# Patient Record
Sex: Male | Born: 1985
Health system: Southern US, Community
[De-identification: ages and names within clinical notes are randomized; demographics above are authoritative.]

## PROBLEM LIST (undated history)

## (undated) DIAGNOSIS — J45909 Unspecified asthma, uncomplicated: Secondary | ICD-10-CM

## (undated) DIAGNOSIS — F1910 Other psychoactive substance abuse, uncomplicated: Secondary | ICD-10-CM

## (undated) HISTORY — PX: SHOULDER SURGERY: SHX246

## (undated) HISTORY — DX: Unspecified asthma, uncomplicated: J45.909

## (undated) HISTORY — DX: Other psychoactive substance abuse, uncomplicated: F19.10

---

## 2016-01-01 DIAGNOSIS — J189 Pneumonia, unspecified organism: Secondary | ICD-10-CM | POA: Diagnosis not present

## 2016-01-17 ENCOUNTER — Other Ambulatory Visit: Payer: Self-pay

## 2016-01-29 DIAGNOSIS — R062 Wheezing: Secondary | ICD-10-CM | POA: Diagnosis not present

## 2016-01-29 DIAGNOSIS — F17210 Nicotine dependence, cigarettes, uncomplicated: Secondary | ICD-10-CM | POA: Diagnosis not present

## 2016-01-29 DIAGNOSIS — J45901 Unspecified asthma with (acute) exacerbation: Secondary | ICD-10-CM | POA: Diagnosis not present

## 2016-02-17 ENCOUNTER — Encounter (HOSPITAL_COMMUNITY): Payer: Self-pay | Admitting: Emergency Medicine

## 2016-02-17 ENCOUNTER — Inpatient Hospital Stay (HOSPITAL_COMMUNITY)
Admission: EM | Admit: 2016-02-17 | Discharge: 2016-02-19 | DRG: 202 | Disposition: A | Discharge status: Home / Self Care | Payer: BLUE CROSS/BLUE SHIELD | Attending: Internal Medicine | Admitting: Internal Medicine

## 2016-02-17 ENCOUNTER — Observation Stay (HOSPITAL_COMMUNITY): Payer: BLUE CROSS/BLUE SHIELD

## 2016-02-17 ENCOUNTER — Emergency Department (HOSPITAL_COMMUNITY): Payer: BLUE CROSS/BLUE SHIELD

## 2016-02-17 DIAGNOSIS — F1120 Opioid dependence, uncomplicated: Secondary | ICD-10-CM | POA: Diagnosis present

## 2016-02-17 DIAGNOSIS — J310 Chronic rhinitis: Secondary | ICD-10-CM | POA: Diagnosis present

## 2016-02-17 DIAGNOSIS — T380X5A Adverse effect of glucocorticoids and synthetic analogues, initial encounter: Secondary | ICD-10-CM | POA: Diagnosis present

## 2016-02-17 DIAGNOSIS — I10 Essential (primary) hypertension: Secondary | ICD-10-CM | POA: Diagnosis present

## 2016-02-17 DIAGNOSIS — J45902 Unspecified asthma with status asthmaticus: Secondary | ICD-10-CM | POA: Diagnosis not present

## 2016-02-17 DIAGNOSIS — J209 Acute bronchitis, unspecified: Secondary | ICD-10-CM | POA: Diagnosis not present

## 2016-02-17 DIAGNOSIS — Z720 Tobacco use: Secondary | ICD-10-CM | POA: Diagnosis not present

## 2016-02-17 DIAGNOSIS — J4540 Moderate persistent asthma, uncomplicated: Secondary | ICD-10-CM | POA: Diagnosis present

## 2016-02-17 DIAGNOSIS — R739 Hyperglycemia, unspecified: Secondary | ICD-10-CM | POA: Diagnosis present

## 2016-02-17 DIAGNOSIS — F17210 Nicotine dependence, cigarettes, uncomplicated: Secondary | ICD-10-CM | POA: Diagnosis present

## 2016-02-17 DIAGNOSIS — R0602 Shortness of breath: Secondary | ICD-10-CM | POA: Diagnosis not present

## 2016-02-17 DIAGNOSIS — J9601 Acute respiratory failure with hypoxia: Secondary | ICD-10-CM | POA: Diagnosis present

## 2016-02-17 DIAGNOSIS — J9801 Acute bronchospasm: Secondary | ICD-10-CM | POA: Diagnosis present

## 2016-02-17 DIAGNOSIS — F10239 Alcohol dependence with withdrawal, unspecified: Secondary | ICD-10-CM | POA: Diagnosis present

## 2016-02-17 DIAGNOSIS — R069 Unspecified abnormalities of breathing: Secondary | ICD-10-CM | POA: Diagnosis not present

## 2016-02-17 DIAGNOSIS — R0902 Hypoxemia: Secondary | ICD-10-CM | POA: Diagnosis present

## 2016-02-17 DIAGNOSIS — F11288 Opioid dependence with other opioid-induced disorder: Secondary | ICD-10-CM | POA: Diagnosis not present

## 2016-02-17 DIAGNOSIS — R0600 Dyspnea, unspecified: Secondary | ICD-10-CM

## 2016-02-17 DIAGNOSIS — F419 Anxiety disorder, unspecified: Secondary | ICD-10-CM | POA: Diagnosis present

## 2016-02-17 LAB — LACTIC ACID, PLASMA
Lactic Acid, Venous: 2.7 mmol/L (ref 0.5–2.0)
Lactic Acid, Venous: 3 mmol/L (ref 0.5–2.0)

## 2016-02-17 LAB — PHOSPHORUS: Phosphorus: 2.1 mg/dL — ABNORMAL LOW (ref 2.5–4.6)

## 2016-02-17 LAB — HEPATIC FUNCTION PANEL
ALBUMIN: 5 g/dL (ref 3.5–5.0)
ALK PHOS: 89 U/L (ref 38–126)
ALT: 35 U/L (ref 17–63)
AST: 25 U/L (ref 15–41)
BILIRUBIN TOTAL: 1.1 mg/dL (ref 0.3–1.2)
Bilirubin, Direct: 0.2 mg/dL (ref 0.1–0.5)
Indirect Bilirubin: 0.9 mg/dL (ref 0.3–0.9)
TOTAL PROTEIN: 8 g/dL (ref 6.5–8.1)

## 2016-02-17 LAB — BASIC METABOLIC PANEL
Anion gap: 8 (ref 5–15)
BUN: 14 mg/dL (ref 6–20)
CALCIUM: 9.3 mg/dL (ref 8.9–10.3)
CO2: 27 mmol/L (ref 22–32)
Chloride: 104 mmol/L (ref 101–111)
Creatinine, Ser: 0.85 mg/dL (ref 0.61–1.24)
GFR calc Af Amer: >60 mL/min (ref >60)
GLUCOSE: 120 mg/dL — AB (ref 65–99)
Potassium: 3.5 mmol/L (ref 3.5–5.1)
SODIUM: 139 mmol/L (ref 135–145)

## 2016-02-17 LAB — D-DIMER, QUANTITATIVE (NOT AT ARMC)

## 2016-02-17 LAB — MRSA PCR SCREENING: MRSA BY PCR: NEGATIVE

## 2016-02-17 LAB — CBC WITH DIFFERENTIAL/PLATELET
BASOS ABS: 0.1 10*3/uL (ref 0.0–0.1)
BASOS PCT: 1 %
EOS ABS: 0.9 10*3/uL — AB (ref 0.0–0.7)
EOS PCT: 6 %
HCT: 46.6 % (ref 39.0–52.0)
Hemoglobin: 16.8 g/dL (ref 13.0–17.0)
Lymphocytes Relative: 11 %
Lymphs Abs: 1.6 10*3/uL (ref 0.7–4.0)
MCH: 30.7 pg (ref 26.0–34.0)
MCHC: 36.1 g/dL — ABNORMAL HIGH (ref 30.0–36.0)
MCV: 85.2 fL (ref 78.0–100.0)
MONO ABS: 0.9 10*3/uL (ref 0.1–1.0)
Monocytes Relative: 6 %
Neutro Abs: 11.8 10*3/uL — ABNORMAL HIGH (ref 1.7–7.7)
Neutrophils Relative %: 78 %
PLATELETS: 201 10*3/uL (ref 150–400)
RBC: 5.47 MIL/uL (ref 4.22–5.81)
RDW: 12.6 % (ref 11.5–15.5)
WBC: 15.2 10*3/uL — AB (ref 4.0–10.5)

## 2016-02-17 LAB — BLOOD GAS, ARTERIAL
Acid-base deficit: 0.2 mmol/L (ref 0.0–2.0)
BICARBONATE: 24.9 meq/L — AB (ref 20.0–24.0)
DRAWN BY: 257701
O2 CONTENT: 6 L/min
O2 Saturation: 90.1 %
PH ART: 7.369 (ref 7.350–7.450)
Patient temperature: 98.6
TCO2: 21.3 mmol/L (ref 0–100)
pCO2 arterial: 44.2 mmHg (ref 35.0–45.0)
pO2, Arterial: 62.1 mmHg — ABNORMAL LOW (ref 80.0–100.0)

## 2016-02-17 LAB — TROPONIN I

## 2016-02-17 LAB — ETHANOL: Alcohol, Ethyl (B): <5 mg/dL (ref <5)

## 2016-02-17 LAB — BRAIN NATRIURETIC PEPTIDE: B Natriuretic Peptide: 9.4 pg/mL (ref 0.0–100.0)

## 2016-02-17 LAB — RAPID URINE DRUG SCREEN, HOSP PERFORMED
Amphetamines: NOT DETECTED
BENZODIAZEPINES: NOT DETECTED
Barbiturates: NOT DETECTED
COCAINE: NOT DETECTED
OPIATES: POSITIVE — AB
Tetrahydrocannabinol: NOT DETECTED

## 2016-02-17 LAB — MAGNESIUM: Magnesium: 2.2 mg/dL (ref 1.7–2.4)

## 2016-02-17 LAB — PROCALCITONIN: Procalcitonin: <0.1 ng/mL

## 2016-02-17 MED ORDER — HYDRALAZINE HCL 20 MG/ML IJ SOLN
INTRAMUSCULAR | Status: AC
  Filled 2016-02-17: qty 1

## 2016-02-17 MED ORDER — ALBUTEROL SULFATE (2.5 MG/3ML) 0.083% IN NEBU
2.5000 mg | INHALATION_SOLUTION | RESPIRATORY_TRACT | Status: DC | PRN
  Administered 2016-02-17: 2.5 mg via RESPIRATORY_TRACT
  Filled 2016-02-17 (×2): qty 3

## 2016-02-17 MED ORDER — METHYLPREDNISOLONE SODIUM SUCC 40 MG IJ SOLR
40.0000 mg | Freq: Three times a day (TID) | INTRAMUSCULAR | Status: DC
  Administered 2016-02-17: 40 mg via INTRAVENOUS
  Filled 2016-02-17 (×2): qty 1

## 2016-02-17 MED ORDER — BUPRENORPHINE HCL 2 MG SL SUBL
2.0000 mg | SUBLINGUAL_TABLET | Freq: Every day | SUBLINGUAL | Status: DC
  Filled 2016-02-17: qty 1

## 2016-02-17 MED ORDER — ALBUTEROL (5 MG/ML) CONTINUOUS INHALATION SOLN
10.0000 mg/h | INHALATION_SOLUTION | RESPIRATORY_TRACT | Status: DC
  Administered 2016-02-17: 10 mg/h via RESPIRATORY_TRACT
  Filled 2016-02-17: qty 20

## 2016-02-17 MED ORDER — GABAPENTIN 100 MG PO CAPS
200.0000 mg | ORAL_CAPSULE | Freq: Three times a day (TID) | ORAL | Status: DC
  Administered 2016-02-17 – 2016-02-18 (×2): 200 mg via ORAL
  Filled 2016-02-17 (×2): qty 2

## 2016-02-17 MED ORDER — HYDRALAZINE HCL 20 MG/ML IJ SOLN
10.0000 mg | INTRAMUSCULAR | Status: DC | PRN
  Filled 2016-02-17: qty 1

## 2016-02-17 MED ORDER — ACETAMINOPHEN 325 MG PO TABS
650.0000 mg | ORAL_TABLET | Freq: Four times a day (QID) | ORAL | Status: DC | PRN
  Administered 2016-02-18: 650 mg via ORAL
  Filled 2016-02-17: qty 2

## 2016-02-17 MED ORDER — SODIUM CHLORIDE 0.9 % IV SOLN: 250.0000 mL | INTRAVENOUS | Status: DC | PRN

## 2016-02-17 MED ORDER — TRAZODONE HCL 50 MG PO TABS
100.0000 mg | ORAL_TABLET | Freq: Every day | ORAL | Status: DC
  Administered 2016-02-17 – 2016-02-18 (×2): 100 mg via ORAL
  Filled 2016-02-17: qty 2
  Filled 2016-02-17: qty 1
  Filled 2016-02-17: qty 2

## 2016-02-17 MED ORDER — NICOTINE 14 MG/24HR TD PT24
14.0000 mg | MEDICATED_PATCH | Freq: Every day | TRANSDERMAL | Status: DC
  Administered 2016-02-17 – 2016-02-19 (×3): 14 mg via TRANSDERMAL
  Filled 2016-02-17 (×3): qty 1

## 2016-02-17 MED ORDER — BUDESONIDE 0.5 MG/2ML IN SUSP
0.5000 mg | Freq: Two times a day (BID) | RESPIRATORY_TRACT | Status: DC
  Administered 2016-02-17 – 2016-02-18 (×3): 0.5 mg via RESPIRATORY_TRACT
  Filled 2016-02-17 (×3): qty 2

## 2016-02-17 MED ORDER — GUAIFENESIN ER 600 MG PO TB12
600.0000 mg | ORAL_TABLET | Freq: Two times a day (BID) | ORAL | Status: DC
  Administered 2016-02-17: 600 mg via ORAL
  Filled 2016-02-17: qty 1

## 2016-02-17 MED ORDER — SODIUM CHLORIDE 0.9% FLUSH
3.0000 mL | Freq: Two times a day (BID) | INTRAVENOUS | Status: DC
  Administered 2016-02-17: 3 mL via INTRAVENOUS

## 2016-02-17 MED ORDER — GUAIFENESIN 100 MG/5ML PO SOLN: 10.0000 mL | ORAL | Status: DC | PRN

## 2016-02-17 MED ORDER — HYDRALAZINE HCL 20 MG/ML IJ SOLN
10.0000 mg | INTRAMUSCULAR | Status: AC
  Administered 2016-02-17: 10 mg via INTRAVENOUS

## 2016-02-17 MED ORDER — IPRATROPIUM-ALBUTEROL 0.5-2.5 (3) MG/3ML IN SOLN: 3.0000 mL | Freq: Four times a day (QID) | RESPIRATORY_TRACT | Status: DC

## 2016-02-17 MED ORDER — LEVOFLOXACIN IN D5W 750 MG/150ML IV SOLN
750.0000 mg | INTRAVENOUS | Status: DC
  Administered 2016-02-17: 750 mg via INTRAVENOUS
  Filled 2016-02-17: qty 150

## 2016-02-17 MED ORDER — DEXMEDETOMIDINE HCL IN NACL 200 MCG/50ML IV SOLN
0.4000 ug/kg/h | INTRAVENOUS | Status: DC
  Administered 2016-02-17 (×2): 0.4 ug/kg/h via INTRAVENOUS
  Filled 2016-02-17 (×2): qty 50

## 2016-02-17 MED ORDER — SODIUM CHLORIDE 0.9% FLUSH: 3.0000 mL | INTRAVENOUS | Status: DC | PRN

## 2016-02-17 MED ORDER — POLYETHYLENE GLYCOL 3350 17 G PO PACK: 17.0000 g | PACK | Freq: Every day | ORAL | Status: DC | PRN

## 2016-02-17 MED ORDER — IPRATROPIUM BROMIDE 0.02 % IN SOLN
0.5000 mg | RESPIRATORY_TRACT | Status: DC
  Administered 2016-02-17 – 2016-02-18 (×4): 0.5 mg via RESPIRATORY_TRACT
  Filled 2016-02-17 (×4): qty 2.5

## 2016-02-17 MED ORDER — DIPHENHYDRAMINE HCL 50 MG/ML IJ SOLN
50.0000 mg | Freq: Once | INTRAMUSCULAR | Status: AC
  Administered 2016-02-17: 50 mg via INTRAVENOUS
  Filled 2016-02-17: qty 1

## 2016-02-17 MED ORDER — IPRATROPIUM BROMIDE 0.02 % IN SOLN
0.5000 mg | Freq: Once | RESPIRATORY_TRACT | Status: AC
  Administered 2016-02-17: 0.5 mg via RESPIRATORY_TRACT
  Filled 2016-02-17: qty 2.5

## 2016-02-17 MED ORDER — LEVALBUTEROL HCL 0.63 MG/3ML IN NEBU
0.6300 mg | INHALATION_SOLUTION | Freq: Four times a day (QID) | RESPIRATORY_TRACT | Status: DC
  Administered 2016-02-17 – 2016-02-19 (×8): 0.63 mg via RESPIRATORY_TRACT
  Filled 2016-02-17 (×8): qty 3

## 2016-02-17 MED ORDER — HEPARIN SODIUM (PORCINE) 5000 UNIT/ML IJ SOLN
5000.0000 [IU] | Freq: Three times a day (TID) | INTRAMUSCULAR | Status: DC
  Administered 2016-02-17 – 2016-02-19 (×5): 5000 [IU] via SUBCUTANEOUS
  Filled 2016-02-17 (×7): qty 1

## 2016-02-17 MED ORDER — MAGNESIUM SULFATE 2 GM/50ML IV SOLN
2.0000 g | Freq: Once | INTRAVENOUS | Status: AC
  Administered 2016-02-17: 2 g via INTRAVENOUS
  Filled 2016-02-17: qty 50

## 2016-02-17 MED ORDER — ACETAMINOPHEN 650 MG RE SUPP: 650.0000 mg | Freq: Four times a day (QID) | RECTAL | Status: DC | PRN

## 2016-02-17 MED ORDER — METHYLPREDNISOLONE SODIUM SUCC 40 MG IJ SOLR
40.0000 mg | Freq: Four times a day (QID) | INTRAMUSCULAR | Status: DC
Start: 2016-02-18 — End: 2016-02-19
  Administered 2016-02-18 – 2016-02-19 (×6): 40 mg via INTRAVENOUS
  Filled 2016-02-17 (×6): qty 1

## 2016-02-17 MED ORDER — AZITHROMYCIN 250 MG PO TABS
500.0000 mg | ORAL_TABLET | Freq: Every day | ORAL | Status: DC
  Administered 2016-02-17: 500 mg via ORAL
  Filled 2016-02-17: qty 1

## 2016-02-17 MED ORDER — ONDANSETRON HCL 4 MG/2ML IJ SOLN: 4.0000 mg | Freq: Four times a day (QID) | INTRAMUSCULAR | Status: DC | PRN

## 2016-02-17 MED ORDER — SODIUM CHLORIDE 0.9 % IV SOLN
INTRAVENOUS | Status: DC
  Administered 2016-02-17: 18:00:00 via INTRAVENOUS

## 2016-02-17 MED ORDER — SENNA 8.6 MG PO TABS
1.0000 | ORAL_TABLET | Freq: Two times a day (BID) | ORAL | Status: DC
  Administered 2016-02-18 – 2016-02-19 (×3): 8.6 mg via ORAL
  Filled 2016-02-17 (×4): qty 1

## 2016-02-17 MED ORDER — ONDANSETRON HCL 4 MG PO TABS: 4.0000 mg | ORAL_TABLET | Freq: Four times a day (QID) | ORAL | Status: DC | PRN

## 2016-02-17 MED ORDER — INSULIN ASPART 100 UNIT/ML ~~LOC~~ SOLN
0.0000 [IU] | SUBCUTANEOUS | Status: DC
  Administered 2016-02-17: 2 [IU] via SUBCUTANEOUS
  Administered 2016-02-18: 3 [IU] via SUBCUTANEOUS
  Administered 2016-02-18 (×2): 2 [IU] via SUBCUTANEOUS

## 2016-02-17 NOTE — Progress Notes (Signed)
Pt's BP high, HR ST 120 -130s, increased work of breathing. Notified RR RN to assess. Notified MD. New orders written. No changes with medications ordered

## 2016-02-17 NOTE — ED Notes (Signed)
Per EMS pt comes from urgent care for shortness of breath, non productive cough, and wheezing. Patient was treated couple weeks ago for PNA and completed round of antibiotics but still having cough and SHOB.  Pat given in route and at Urgent care Albuterol 7.5mg , Atrovent 0.5mg , Solu-Medrol 125mg .  Patient initial saturation at Urgent care was 87% on room air, BP 158/98.

## 2016-02-17 NOTE — Progress Notes (Signed)
RT attempted BIPAP and Pt became very anxious and stated that he couldn't breathe.  RT removed BIPAP and placed Pt back on [place] at 10 LPM O2.  Pt resting comfortably at this time, MD aware.  RT to monitor and assess as needed.

## 2016-02-17 NOTE — Progress Notes (Signed)
CRITICAL VALUE ALERT  Critical value received:  Lactic acid 3.0  Date of notification:  02/17/16  Time of notification:  2323  Critical value read back: Yes  Nurse who received alert:  Anne HahnSarah Beth Early, RN  MD notified (1st page):  L. Harduk  Time of first page:  2325  MD notified (2nd page):  Time of second page:  Responding MD:  Wyn ForsterL. Harduk  Time MD responded:  2325

## 2016-02-17 NOTE — ED Provider Notes (Signed)
CSN: [id number]     Arrival date & time 02/17/16  1315 History   First MD Initiated Contact with Patient 02/17/16 1317     Chief Complaint  Patient presents with  . Shortness of Breath  . Wheezing    HPI   Evan Hines is an 30 y.o. male with no significant PMH who presents to the ED via EMS from urgent care for evaluation of SOB and wheezing. Pt states that he was treated for pneumonia one month ago. Since then has felt well until 2 days ago when he began devloping some nasal congestion and progressively worsening productive cough. States that after his coughing fits he feels short of breath, and the shortness of breath is getting worse. States today it was to the point where he didn't know if he would be able to walk from his car in the parking lot to the urgent care lobby. Pt was apparently hypoxic to 87% on RA on arrival to [facility]. He has thus far been given a total of 7.5mg  albuterol neb, 0.5 mg atrovent, and 125 mg solu-medrol. He denies h/o asthma or any other medical issues. Admits to smoking cigarettes but states he is trying to quit and has not smoked anything in five days. Prior to this week he smoked ~1 PPD. Denies other drug use.  History reviewed. No pertinent past medical history. Past Surgical History  Procedure Laterality Date  . Shoulder surgery     No family history on file. Social History  Substance Use Topics  . Smoking status: Smoker, Current Status Unknown    Types: Cigarettes  . Smokeless tobacco: Current User    Types: Chew  . Alcohol Use: 3.0 oz/week    5 Cans of beer per week    Review of Systems  All other systems reviewed and are negative.     Allergies  Review of patient's allergies indicates not on file.  Home Medications   Prior to Admission medications   Not on File   BP 154/100 mmHg  Pulse 99  Temp(Src) 98.1 F (36.7 C) (Oral)  Resp 24  SpO2 99% Physical Exam  Constitutional: He is oriented to person, place, and time.  Wearing nebulizer  mask  HENT:  Right Ear: External ear normal.  Left Ear: External ear normal.  Nose: Nose normal.  Mouth/Throat: Oropharynx is clear and moist. No oropharyngeal exudate.  Eyes: Conjunctivae and EOM are normal. Pupils are equal, round, and reactive to light.  Neck: Normal range of motion. Neck supple.  Cardiovascular: Normal rate, regular rhythm, normal heart sounds and intact distal pulses.   Pulmonary/Chest:  Mildly increased WOB  Mild tachypnea Diffuse bilateral inspiratory and expiratory wheezing. Bilateral diffuse soft rhonchi.  Abdominal: Soft. He exhibits no distension. There is no tenderness.  Musculoskeletal: He exhibits no edema.  Neurological: He is alert and oriented to person, place, and time. No cranial nerve deficit.  Skin: Skin is warm and dry.  Psychiatric: He has a normal mood and affect.  Nursing note and vitals reviewed.  Filed Vitals:   02/17/16 1409 02/17/16 1411 02/17/16 1414 02/17/16 1417  BP:      Pulse:      Temp:      TempSrc:      Resp:      SpO2: 90% 91% 94% 99%     ED Course  Procedures (including critical care time) Labs Review Labs Reviewed  BASIC METABOLIC PANEL - Abnormal; Notable for the following:    Glucose, Bld 120 (*)  All other components within normal limits  CBC WITH DIFFERENTIAL/PLATELET - Abnormal; Notable for the following:    WBC 15.2 (*)    MCHC 36.1 (*)    Neutro Abs 11.8 (*)    Eosinophils Absolute 0.9 (*)    All other components within normal limits  URINE RAPID DRUG SCREEN, HOSP PERFORMED    Imaging Review Dg Chest 2 View  02/17/2016  CLINICAL DATA:  Shortness of breath and wheezing EXAM: CHEST  2 VIEW COMPARISON:  None. FINDINGS: Cardiac shadow is within normal limits. The lungs are free of acute infiltrate or sizable effusion. Mild peribronchial cuffing is noted consistent with bronchitis/reactive airways disease. The upper abdomen is within normal limits. No bony abnormality is seen. IMPRESSION: Peribronchial  thickening as described. Electronically Signed   By: Alcide CleverMark  Lukens M.D.   On: 02/17/2016 14:14   I have personally reviewed and evaluated these images and lab results as part of my medical decision-making.   EKG Interpretation None      Meds ordered this encounter  Medications  . albuterol (PROVENTIL,VENTOLIN) solution continuous neb    Sig:   . magnesium sulfate IVPB 2 g 50 mL    Sig:   . ipratropium (ATROVENT) nebulizer solution 0.5 mg    Sig:     MDM   Final diagnoses:  Bronchospasm  Acute respiratory failure with hypoxia (HCC)    Will give continuous albuterol and mag sulfate. Will check CXR and basic labs. Pt with good SpO2 with mask in place.   Pt with continued inspiratory and expiratory wheezing on exam. Will add another dose of atrovent. CXR reveals bronchitic/RAD findings. Labs with leukocytosis of 15.2. ?viral. Low risk for PE other than cigarette use; no recent travel, h/o blood clots, LE edema or calf tenderness, malignancy, recent surgeries. On re-eval pt's mother is in the room and does mention that pt is on suboxone therapy, though pt continues to deny illicit drug use. Will add UDS. Unfortunately although pt maintains good SpO2 with nebullizer mask he de-sats to mid 80s on room air, even high 80s with nasal cannula. I discussed medical admission with pt and his mother for acute respiratory failure/bronchospasm. They are amenable with this plan.  I spoke to the hospitalist team who will come see and admit patient. Appreciate assistance.  Carlene CoriaSerena Y Dyani Babel, PA-C 02/17/16 [address]1506  Chai Routh Y Crystal LakeSam, PA-C 02/17/16 1511  Arby BarretteMarcy Pfeiffer, MD 02/17/16 [phone]

## 2016-02-17 NOTE — Progress Notes (Addendum)
CRITICAL VALUE ALERT  Critical value received:  Lactic acid 2.7  Date of notification:  02/17/16  Time of notification:  1940  Critical value read back: Yes  Nurse who received alert:  Leonia CoronaMelinda R Blima Jaimes, RN  MD notified (1st page):  [facility]  Time of first page:  1940  MD notified (2nd page):  Time of second page:  Responding MD:  Delphina CahillJose Angelo Dios  Time MD responded:  [id number]

## 2016-02-17 NOTE — ED Notes (Signed)
Bed: WA20 Expected date:  Expected time:  Means of arrival:  Comments: EMS- shortness of breath, young

## 2016-02-17 NOTE — Consult Note (Signed)
PULMONARY / CRITICAL CARE MEDICINE   Name: Evan Hines MRN: [id number] DOB: 04-14-1986    ADMISSION DATE:  02/17/2016 CONSULTATION DATE:  5/25  REFERRING MD:  TH  Dr. Shon Hale  CHIEF COMPLAINT:  SOB  HISTORY OF PRESENT ILLNESS:   Evan Hines is a 30 y.o. Male, with PMH Relevant for polysubstance abuse and tobacco use disorder who presents with concerns about recurrent respiratory problems with hypoxia . Patient was seen urgent care for evaluation of SOB and wheezing, he was found to be hypoxic and transferred to the ED by EMS after receiving steroids and breathing treatments. Pt states that he was treated for pneumonia with antibiotics and steroids one month ago. Since then has felt well until 2 days ago when he began devloping some nasal congestion and progressively worsening productive cough. No significant sneezing or itching eyes. States that after his coughing fits he feels short of breath, and the shortness of breath is getting worse. States today it was to the point where he didn't know if he would be able to walk from his car in the parking lot to the urgent care lobby. Pt was apparently hypoxic to 87% on RA on arrival to [facility]. He has thus far been given a total of 7.5mg  albuterol neb, 0.5 mg atrovent, and 125 mg solu-medrol. He denies h/o asthma. Despite being on 4-5 L of oxygen via nasal canula in the ED his O2 sats remained at 88-90%. Cough is mostly dry. No leg pains no leg swelling no pleuritic symptoms no chest pain. No prolonged immobilization, no recent surgery, no long travels.  Pt was admitted at the floors this pm.  He was noted to be wheezing more and more SOB when he walked to the BR. Plan was to transfer him to SDU and be placed on BiPaP.  PCCM was consulted.   Bipap was started and pt became claustrophobic. He was switched back to [place] with reservoir and looks more comfortable than bipap. Gets SOB when speaking sentences. Comfortable when at rest.   Pt was noted to be  anxious as well.  By history, pt looks like has asthma but has not been formally dxed. The last 1-2 yrs, he gets episodic SOB, wheezing, cough when seasons change or when he gets a bronchitis.  Usually gets better with PO pred and alb MDI/neb.  The last attack was start of May 2017 > got better with PO pred and Abx.  He was at baseline until 1-2 days ago. Prior to May, last attack was 1 yr ago.  Has not been intubated, no ED visit until today.   PAST MEDICAL HISTORY :  He  has no past medical history on file.  Polysubstance abuse.  As above.  (-) DVT, CA  PAST SURGICAL HISTORY: He  has past surgical history that includes Shoulder surgery.  No Known Allergies  No current facility-administered medications on file prior to encounter.   No current outpatient prescriptions on file prior to encounter.    FAMILY HISTORY:  His has no family status information on file.   Parents are healthy.   SOCIAL HISTORY: He  reports that he has been smoking Cigarettes.  He has been smoking about 1.00 pack per day. His smokeless tobacco use includes Chew. He reports that he drinks about 3.0 oz of alcohol per week. Allegedly stopped drinking weeks ago.  Single, lives with parents. Works at a [employer]. No children.  (-) recent sick contacts.   REVIEW OF SYSTEMS:  Recent SOB, cough, wheezing. Denies fevers, chills, cp. Rest of 14 point ROV was done and everything else was (-).   SUBJECTIVE:  As mentioned.  Has SOB, cough, wheeze.  Better now than when we has admitted earlier.   VITAL SIGNS: BP 177/86 mmHg  Pulse 113  Temp(Src) 98 F (36.7 C) (Oral)  Resp 24  SpO2 97%  HEMODYNAMICS:    VENTILATOR SETTINGS:    INTAKE / OUTPUT:    PHYSICAL EXAMINATION: General:  Awake, in mild distress.  Gets SOB after talking for awhile. Looks comfortable at rest.  Neuro:  CN grossly intact. (-) lateralizing signs.  HEENT:  PERLA, (-) NVD. (-) oral thrush.  Cardiovascular:  Good s1/s2. Tachycardic.  (-) s3/m/r/g Lungs:  Fair ae. Wheezing Bilaterally. Some accessory muscle use every now and then.  Abdomen:  (+) BS, soft, NT. (-) masses/tenderness Musculoskeletal:  Moves all extremities normally. N tone/strength. Skin:  Warm and dry. (-) rash/clubbing/edema/cyanosis  LABS:  BMET  Recent Labs Lab 02/17/16 1356  NA 139  K 3.5  CL 104  CO2 27  BUN 14  CREATININE 0.85  GLUCOSE 120*    Electrolytes  Recent Labs Lab 02/17/16 1356  CALCIUM 9.3    CBC  Recent Labs Lab 02/17/16 1357  WBC 15.2*  HGB 16.8  HCT 46.6  PLT 201    Coag's No results for input(s): APTT, INR in the last 168 hours.  Sepsis Markers No results for input(s): LATICACIDVEN, PROCALCITON, O2SATVEN in the last 168 hours.  ABG  Recent Labs Lab 02/17/16 1621  PHART 7.369  PCO2ART 44.2  PO2ART 62.1*    Liver Enzymes No results for input(s): AST, ALT, ALKPHOS, BILITOT, ALBUMIN in the last 168 hours.  Cardiac Enzymes No results for input(s): TROPONINI, PROBNP in the last 168 hours.  Glucose No results for input(s): GLUCAP in the last 168 hours.  Imaging Dg Chest 2 View  02/17/2016  CLINICAL DATA:  Shortness of breath and wheezing EXAM: CHEST  2 VIEW COMPARISON:  None. FINDINGS: Cardiac shadow is within normal limits. The lungs are free of acute infiltrate or sizable effusion. Mild peribronchial cuffing is noted consistent with bronchitis/reactive airways disease. The upper abdomen is within normal limits. No bony abnormality is seen. IMPRESSION: Peribronchial thickening as described. Electronically Signed   By: Alcide CleverMark  Lukens M.D.   On: 02/17/2016 14:14     STUDIES:    CULTURES: Blood culture 5/25 > MRSA 5/25 >   ANTIBIOTICS: Azithromycin 5/25 Levofloxacin 5/25 >   SIGNIFICANT EVENTS: 5/25 admitted for asthmatic bronchits. Got more SOB and desaturated on the floors. Transferred to SDU. PCCM consulted.   LINES/TUBES:   DISCUSSION: 55M, with polysubstance abuse and tobacco  abuse, with likely asthma (not been dxed before),  admitted for acute SOB, wheezing, cough, chest tightness. Was placed on 6L [place] at the floors and he desatuarated in high 80s walking to bathroom. Transferred to SDU and PCCM consulted.  Pt is also anxious.  Placed on Bipap and he became claustrophobic.  Switched to [place] with reservoir and he looks better than being on Bipap but still has inc WOB every now and then.  On 8-10L [place] and sats are 98%.    ASSESSMENT / PLAN:  PULMONARY A: Acute Hypoxemic Hypercapneic Resp Fx 2/2 Severe Asthma attack 2/2 bronchitis, R/O PNA Tobacco Abuse P:   By history, it seems patient has asthma which has never been diagnosed before.  He has episodes dyspnea, wheezing, shortness of breath, cough the last 2-3  years. Posey Rea of the triggers but usually with a change in weather and also when he has an infection. Has never been intubated. No ED visit until today. He gets better with by mouth prednisone and of the alb  MDI or nebulizer.  Patient became claustrophobic and more tachypneic  on the BiPAP with minimal settings. We switch him back to the nasal cannula with reservoir and he looks more comfortable. Currently on 8-10 L nasal cannula oxygen but his O2 saturations are 98-100%. Plan to cut down oxygen to keep O2 sats more than 92%.  We will keep an eye on him at the stepdown unit. Patient is also anxious. We'll try him on Precedex. He also has polysubstance abuse. Later on, we can try him on BiPAP again if he gets more short of breath. If he gets winded and could not tolerate BiPAP, he may end up being intubated.  He just finished an hour-long albuterol. Start Pulmicort neb twice a day, Atrovent every 4 hours, Xopenex every 6 hours Continue Medrol. Will make it every 6 hrs.  Start levofloxacin. Pan culture.  On discharge, most likely will need a prolonged prednisone taper. Most likely will also need to be on Symbicort or Dulera  or Advair. Will need PFTs as an  outpatient.  Counseled on smoking cessation.  CARDIOVASCULAR A:  HTN Sinus tachycardia P:  This is probably related to anxiety. Not sure if he is withdrawing. Drug screen is negative except for opiates. Hydralazine 10 mg every 4 hours when necessary for blood pressure more than 160/90. Will observe. Continue IV fluids  RENAL A:   No issues P:   Cont IVF  GASTROINTESTINAL A:   No issues P:   NPO for now  HEMATOLOGIC A:   No issues P:  Check cbc  INFECTIOUS A:   CXR with no evidence of PNA.  Has bronchitis P:   panculture Start levofloxacin Check lactate, PCT. If PCT elevated, add vanc  ENDOCRINE A:   No issues P:   CBG q4 with sliding scale  NEUROLOGIC A: Polysubstance abuse.  Possibel ETOH withdrawal  Anxiety P:   RASS goal: 0 Try precedex and see if it helps with anxiety/tachycardia/HTN Check alcohol level Need to resume Suboxone in am > need to verify dose, etc.    FAMILY  - Updates: I extensively discussed the case with the patient and his mother. Mentioned about the severity of his asthma exacerbation. Mentioned we may end up intubating the patient if he gets worse. Patient is a full code.  - Inter-disciplinary family meet or Palliative Care meeting due by:  June 1   Critical care time spent on this patient today was 30 minutes. I discussed the case with the hospitalist as well. Plan to keep patient in stepdown unit under the hospitalist service unless he gets worse overnight and he gets transferred to the ICU.  Pollie Meyer, MD Pulmonary and Critical Care Medicine [facility] Pager: [phone] After 3 pm or if no response, call [phone]  02/17/2016, 7:38 PM

## 2016-02-17 NOTE — H&P (Signed)
Patient Demographics:    Evan Hines, is a 30 y.o. male  MRN: [id number]   DOB - 1986-09-02  Admit Date - 02/17/2016  Outpatient Primary MD for the patient is JEWELL, Guy Sandifer, MD   Assessment & Plan:    Principal Problem:   Acute wheezy bronchitis Active Problems:   Tobacco abuse   Opiate dependence (HCC)/Suboxone Rx   1)Acute hypoxic respiratory failure- patient is requiring 4-5 L of oxygen by [place] to keep his O2 sats above 90% despite multiple bronchodilator treatment and IV Solu-Medrol. Check ABG, shortness of breath PERSISTED on and off for about 6-8 weeks,  he is also tachycardic. D -dimer is negative. Continue oxygen supplementation,   No significantly increased work of breathing at this time so c/n oxygen via , unless ABG reveals hypercapnia or if increased work of breathing then use BiPAP. Recurrent visits to the ED and Urgent care over the last couple of Months for respiratory problems with hypoxia. Place patient on Telemetry please due to Hypoxia  2)Acute wheezy bronchitis in smoker-continue bronchodilators, azithromycin, and mucolytics. Leukocytosis may be secondary to steroids, CXR without definite pneumonia. Give Solu-Medrol 40 mg every 8 hours, patient previously received Solu Medrol 125 mg IV 1 today.   3)Nicotine Dependence- patient has been too sick to smoke in the last 5 days, he continues to chew tobacco, abstinence from Nicotine encouraged, give nicotine patch  4)H/o Polysubstance Abuse- encouraged, patient is currently on Suboxone (has been on same dose for 3 years), UDS positive only for opiates. Patient's Mother is concerned about patient's alcohol use given that he is Suboxone. Patient denies recent alcohol use, mom states no etoh use in last several days. Low risk for DTs. Be judicious with  opiates and benzodiazepines in this particular patient  With History of - Reviewed by me  History reviewed. No pertinent past medical history.    Past Surgical History  Procedure Laterality Date  . Shoulder surgery        Chief Complaint  Patient presents with  . Shortness of Breath  . Wheezing  . Cough      HPI:    Evan Hines  is a 30 y.o. male, Evan Hines is an 30 y.o. male with PMH Relevant for polysubstance abuse and tobacco use disorder who presents with concerns about recurrent respiratory problems with hypoxia . Patient was seen urgent care for evaluation of SOB and wheezing, he was found to be hypoxic and transferred to the ED by EMS after receiving steroids and breathing treatments. Pt states that he was treated for pneumonia with antibiotics and steroids one month ago. Since then has felt well until 2 days ago when he began devloping some nasal congestion and progressively worsening productive cough. No significant sneezing or itching eyes. States that after his coughing fits he feels short of breath, and the shortness of breath is getting worse. States today it was to the point  where he didn't know if he would be able to walk from his car in the parking lot to the urgent care lobby. Pt was apparently hypoxic to 87% on RA on arrival to [facility]. He has thus far been given a total of 7.5mg  albuterol neb, 0.5 mg atrovent, and 125 mg solu-medrol. He denies h/o asthma. Despite being on 4-5 L of oxygen via nasal canula in the ED his  O2 sats remained at 88-90%. Cough is mostly dry. No leg pains no leg swelling no pleuritic symptoms no chest pain. No prolonged immobilization, no recent surgery, no long travels. Overall no significant risk factor for DVT/PE identified. Patient's mother is at bedside, questions answered   Review of systems:    In addition to the HPI above,   A full 12 point Review of Systems was done, except as stated above, all other Review of Systems were  negative.    Social History:  Reviewed by me    Social History  Substance Use Topics  . Smoking status: Smoker, Current Status Unknown -- 1.00 packs/day    Types: Cigarettes  . Smokeless tobacco: Current User    Types: Chew  . Alcohol Use: 3.0 oz/week    5 Cans of beer per week       Family History :  Reviewed by me   History reviewed. No pertinent family history.    Home Medications:   Prior to Admission medications   Medication Sig Start Date End Date Taking? Authorizing Provider  albuterol (PROVENTIL) (2.5 MG/3ML) 0.083% nebulizer solution Inhale 3 mLs into the lungs every 6 (six) hours as needed for wheezing or shortness of breath.  01/31/16  Yes Historical Provider, MD  ibuprofen (ADVIL,MOTRIN) 200 MG tablet Take 400 mg by mouth every 6 (six) hours as needed for moderate pain.   Yes Historical Provider, MD  PROAIR HFA 108 [phone] Base) MCG/ACT inhaler Inhale 2 puffs into the lungs every 4 (four) hours as needed for wheezing or shortness of breath.  01/05/16  Yes Historical Provider, MD  SUBOXONE 8-2 MG FILM Place 1 Film under the tongue 2 (two) times daily. 02/10/16  Yes Historical Provider, MD  traZODone (DESYREL) 50 MG tablet Take 50 mg by mouth at bedtime as needed for sleep.  12/01/15  Yes Historical Provider, MD     Allergies:    No Known Allergies   Physical Exam:   Vitals  Blood pressure 130/85, pulse 95, temperature 98.1 F (36.7 C), temperature source Oral, resp. rate 20, SpO2 96 %.  O2 sats while I was in the room was 88-90% on 4-5 L of oxygen  Physical Examination: General appearance - alert, well appearing, and in no distress and Able to speak in complete sentences Mental status - alert, oriented to person, place, and time,  Eyes - sclera anicteric Neck - supple, no JVD elevation , Chest - scattered wheezes and rhonchi bilaterally, air movement is diminished but symmetrical Heart - S1 and S2 normal,  tachycardic with heart rate in the 110s Abdomen - soft,  nontender, nondistended, no masses or organomegaly Neurological - screening mental status exam normal, neck supple without rigidity, cranial nerves II through XII intact, DTR's normal and symmetric Extremities - no pedal edema noted, intact peripheral pulses , neg Homan's Skin - warm, dry    Data Review:    CBC  Recent Labs Lab 02/17/16 1357  WBC 15.2*  HGB 16.8  HCT 46.6  PLT 201  MCV 85.2  MCH 30.7  MCHC  36.1*  RDW 12.6  LYMPHSABS 1.6  MONOABS 0.9  EOSABS 0.9*  BASOSABS 0.1   ------------------------------------------------------------------------------------------------------------------  Chemistries   Recent Labs Lab 02/17/16 1356  NA 139  K 3.5  CL 104  CO2 27  GLUCOSE 120*  BUN 14  CREATININE 0.85  CALCIUM 9.3   ------------------------------------------------------------------------------------------------------------------ CrCl cannot be calculated (Unknown ideal weight.). ------------------------------------------------------------------------------------------------------------------ No results for input(s): TSH, T4TOTAL, T3FREE, THYROIDAB in the last 72 hours.  Invalid input(s): FREET3   Coagulation profile No results for input(s): INR, PROTIME in the last 168 hours. ------------------------------------------------------------------------------------------------------------------- No results for input(s): DDIMER in the last 72 hours. -------------------------------------------------------------------------------------------------------------------  Cardiac Enzymes No results for input(s): CKMB, TROPONINI, MYOGLOBIN in the last 168 hours.  Invalid input(s): CK ------------------------------------------------------------------------------------------------------------------ No results found for: BNP   ---------------------------------------------------------------------------------------------------------------  Urinalysis No results found  for: COLORURINE, APPEARANCEUR, LABSPEC, PHURINE, GLUCOSEU, HGBUR, BILIRUBINUR, KETONESUR, PROTEINUR, UROBILINOGEN, NITRITE, LEUKOCYTESUR  ----------------------------------------------------------------------------------------------------------------   Imaging Results:    Dg Chest 2 View  02/17/2016  CLINICAL DATA:  Shortness of breath and wheezing EXAM: CHEST  2 VIEW COMPARISON:  None. FINDINGS: Cardiac shadow is within normal limits. The lungs are free of acute infiltrate or sizable effusion. Mild peribronchial cuffing is noted consistent with bronchitis/reactive airways disease. The upper abdomen is within normal limits. No bony abnormality is seen. IMPRESSION: Peribronchial thickening as described. Electronically Signed   By: Alcide CleverMark  Lukens M.D.   On: 02/17/2016 14:14    Radiological Exams on Admission: Dg Chest 2 View  02/17/2016  CLINICAL DATA:  Shortness of breath and wheezing EXAM: CHEST  2 VIEW COMPARISON:  None. FINDINGS: Cardiac shadow is within normal limits. The lungs are free of acute infiltrate or sizable effusion. Mild peribronchial cuffing is noted consistent with bronchitis/reactive airways disease. The upper abdomen is within normal limits. No bony abnormality is seen. IMPRESSION: Peribronchial thickening as described. Electronically Signed   By: Alcide CleverMark  Lukens M.D.   On: 02/17/2016 14:14    DVT Prophylaxis heparin  AM Labs Ordered, also please review Full Orders  Family Communication: Admission, patients condition and plan of care including tests being ordered have been discussed with the patient and his mother who indicate understanding and agree with the plan   Code Status - Full Code  Likely DC to  Home   Condition   fair  Isaic Syler M.D on 02/17/2016 at 4:17 PM   Between 7am to 7pm - Pager - [phone]  After 7pm go to [email or web address] - password [email or web address]  [facility] - Office  [phone]  Dragon dictation system was used to create this note, attempts  have been made to correct errors, however presence of uncorrected errors is not a reflection quality of care provided.

## 2016-02-18 DIAGNOSIS — J209 Acute bronchitis, unspecified: Secondary | ICD-10-CM | POA: Diagnosis not present

## 2016-02-18 DIAGNOSIS — R0600 Dyspnea, unspecified: Secondary | ICD-10-CM | POA: Insufficient documentation

## 2016-02-18 DIAGNOSIS — F11288 Opioid dependence with other opioid-induced disorder: Secondary | ICD-10-CM | POA: Diagnosis not present

## 2016-02-18 DIAGNOSIS — F1120 Opioid dependence, uncomplicated: Secondary | ICD-10-CM | POA: Diagnosis present

## 2016-02-18 DIAGNOSIS — J45902 Unspecified asthma with status asthmaticus: Secondary | ICD-10-CM | POA: Diagnosis present

## 2016-02-18 DIAGNOSIS — R739 Hyperglycemia, unspecified: Secondary | ICD-10-CM | POA: Diagnosis present

## 2016-02-18 DIAGNOSIS — F419 Anxiety disorder, unspecified: Secondary | ICD-10-CM | POA: Diagnosis present

## 2016-02-18 DIAGNOSIS — J9601 Acute respiratory failure with hypoxia: Secondary | ICD-10-CM | POA: Diagnosis not present

## 2016-02-18 DIAGNOSIS — F17210 Nicotine dependence, cigarettes, uncomplicated: Secondary | ICD-10-CM | POA: Diagnosis present

## 2016-02-18 DIAGNOSIS — R0902 Hypoxemia: Secondary | ICD-10-CM

## 2016-02-18 DIAGNOSIS — J4552 Severe persistent asthma with status asthmaticus: Secondary | ICD-10-CM | POA: Diagnosis not present

## 2016-02-18 DIAGNOSIS — Z720 Tobacco use: Secondary | ICD-10-CM | POA: Diagnosis not present

## 2016-02-18 DIAGNOSIS — F10239 Alcohol dependence with withdrawal, unspecified: Secondary | ICD-10-CM | POA: Diagnosis present

## 2016-02-18 DIAGNOSIS — T380X5A Adverse effect of glucocorticoids and synthetic analogues, initial encounter: Secondary | ICD-10-CM | POA: Diagnosis present

## 2016-02-18 DIAGNOSIS — J9801 Acute bronchospasm: Secondary | ICD-10-CM | POA: Diagnosis not present

## 2016-02-18 DIAGNOSIS — J310 Chronic rhinitis: Secondary | ICD-10-CM | POA: Diagnosis present

## 2016-02-18 DIAGNOSIS — I10 Essential (primary) hypertension: Secondary | ICD-10-CM | POA: Diagnosis present

## 2016-02-18 LAB — BLOOD GAS, ARTERIAL
Acid-base deficit: 0.4 mmol/L (ref 0.0–2.0)
BICARBONATE: 23.3 meq/L (ref 20.0–24.0)
Drawn by: 308601
O2 CONTENT: 8 L/min
O2 Saturation: 95 %
PCO2 ART: 37 mmHg (ref 35.0–45.0)
PH ART: 7.415 (ref 7.350–7.450)
Patient temperature: 98.6
TCO2: 20.1 mmol/L (ref 0–100)
pO2, Arterial: 74.5 mmHg — ABNORMAL LOW (ref 80.0–100.0)

## 2016-02-18 LAB — CBC
HEMATOCRIT: 43.2 % (ref 39.0–52.0)
HEMOGLOBIN: 15.2 g/dL (ref 13.0–17.0)
MCH: 30.2 pg (ref 26.0–34.0)
MCHC: 35.2 g/dL (ref 30.0–36.0)
MCV: 85.7 fL (ref 78.0–100.0)
Platelets: 197 10*3/uL (ref 150–400)
RBC: 5.04 MIL/uL (ref 4.22–5.81)
RDW: 12.9 % (ref 11.5–15.5)
WBC: 12.7 10*3/uL — AB (ref 4.0–10.5)

## 2016-02-18 LAB — BASIC METABOLIC PANEL
ANION GAP: 8 (ref 5–15)
BUN: 13 mg/dL (ref 6–20)
CHLORIDE: 105 mmol/L (ref 101–111)
CO2: 22 mmol/L (ref 22–32)
Calcium: 9.1 mg/dL (ref 8.9–10.3)
Creatinine, Ser: 0.72 mg/dL (ref 0.61–1.24)
Glucose, Bld: 155 mg/dL — ABNORMAL HIGH (ref 65–99)
POTASSIUM: 3.9 mmol/L (ref 3.5–5.1)
SODIUM: 135 mmol/L (ref 135–145)

## 2016-02-18 LAB — URINALYSIS, ROUTINE W REFLEX MICROSCOPIC
BILIRUBIN URINE: NEGATIVE
Glucose, UA: NEGATIVE mg/dL
HGB URINE DIPSTICK: NEGATIVE
Ketones, ur: NEGATIVE mg/dL
Leukocytes, UA: NEGATIVE
NITRITE: NEGATIVE
PROTEIN: NEGATIVE mg/dL
Specific Gravity, Urine: 1.021 (ref 1.005–1.030)
pH: 6.5 (ref 5.0–8.0)

## 2016-02-18 LAB — GLUCOSE, CAPILLARY
GLUCOSE-CAPILLARY: 176 mg/dL — AB (ref 65–99)
GLUCOSE-CAPILLARY: 130 mg/dL — AB (ref 65–99)
GLUCOSE-CAPILLARY: 122 mg/dL — AB (ref 65–99)
GLUCOSE-CAPILLARY: 160 mg/dL — AB (ref 65–99)
Glucose-Capillary: 136 mg/dL — ABNORMAL HIGH (ref 65–99)
Glucose-Capillary: 127 mg/dL — ABNORMAL HIGH (ref 65–99)
Glucose-Capillary: 122 mg/dL — ABNORMAL HIGH (ref 65–99)

## 2016-02-18 LAB — TROPONIN I: Troponin I: <0.03 ng/mL (ref <0.031)

## 2016-02-18 LAB — PROCALCITONIN

## 2016-02-18 MED ORDER — GUAIFENESIN ER 600 MG PO TB12
1200.0000 mg | ORAL_TABLET | Freq: Two times a day (BID) | ORAL | Status: DC
Start: 2016-02-18 — End: 2016-02-19
  Administered 2016-02-18 – 2016-02-19 (×3): 1200 mg via ORAL
  Filled 2016-02-18 (×3): qty 2

## 2016-02-18 MED ORDER — K PHOS MONO-SOD PHOS DI & MONO 155-852-130 MG PO TABS
500.0000 mg | ORAL_TABLET | Freq: Three times a day (TID) | ORAL | Status: DC
Start: 2016-02-18 — End: 2016-02-19
  Administered 2016-02-18 – 2016-02-19 (×4): 500 mg via ORAL
  Filled 2016-02-18 (×9): qty 2

## 2016-02-18 MED ORDER — FLUTICASONE PROPIONATE 50 MCG/ACT NA SUSP
2.0000 | Freq: Two times a day (BID) | NASAL | Status: DC
  Administered 2016-02-18 – 2016-02-19 (×3): 2 via NASAL
  Filled 2016-02-18: qty 16

## 2016-02-18 MED ORDER — POTASSIUM CHLORIDE CRYS ER 20 MEQ PO TBCR
40.0000 meq | EXTENDED_RELEASE_TABLET | Freq: Once | ORAL | Status: AC
  Administered 2016-02-18: 40 meq via ORAL
  Filled 2016-02-18: qty 2

## 2016-02-18 MED ORDER — ARFORMOTEROL TARTRATE 15 MCG/2ML IN NEBU
15.0000 ug | INHALATION_SOLUTION | Freq: Two times a day (BID) | RESPIRATORY_TRACT | Status: DC
  Administered 2016-02-18 (×2): 15 ug via RESPIRATORY_TRACT
  Filled 2016-02-18 (×2): qty 2

## 2016-02-18 MED ORDER — SALINE SPRAY 0.65 % NA SOLN
1.0000 | NASAL | Status: DC | PRN
  Administered 2016-02-18: 1 via NASAL
  Filled 2016-02-18: qty 44

## 2016-02-18 MED ORDER — FUROSEMIDE 10 MG/ML IJ SOLN
40.0000 mg | Freq: Once | INTRAMUSCULAR | Status: AC
  Administered 2016-02-18: 40 mg via INTRAVENOUS
  Filled 2016-02-18: qty 4

## 2016-02-18 MED ORDER — SALINE SPRAY 0.65 % NA SOLN
2.0000 | Freq: Two times a day (BID) | NASAL | Status: DC
  Administered 2016-02-18 (×2): 2 via NASAL
  Filled 2016-02-18: qty 44

## 2016-02-18 MED ORDER — INSULIN ASPART 100 UNIT/ML ~~LOC~~ SOLN: 0.0000 [IU] | Freq: Every day | SUBCUTANEOUS | Status: DC

## 2016-02-18 MED ORDER — SALINE SPRAY 0.65 % NA SOLN: 1.0000 | NASAL | Status: DC | PRN

## 2016-02-18 MED ORDER — MONTELUKAST SODIUM 10 MG PO TABS
10.0000 mg | ORAL_TABLET | Freq: Every day | ORAL | Status: DC
Start: 2016-02-18 — End: 2016-02-19
  Administered 2016-02-18: 10 mg via ORAL
  Filled 2016-02-18 (×2): qty 1

## 2016-02-18 MED ORDER — INSULIN ASPART 100 UNIT/ML ~~LOC~~ SOLN
0.0000 [IU] | Freq: Three times a day (TID) | SUBCUTANEOUS | Status: DC
  Administered 2016-02-18 – 2016-02-19 (×3): 3 [IU] via SUBCUTANEOUS

## 2016-02-18 MED ORDER — BUPRENORPHINE HCL 2 MG SL SUBL
8.0000 mg | SUBLINGUAL_TABLET | Freq: Two times a day (BID) | SUBLINGUAL | Status: DC
  Administered 2016-02-18 – 2016-02-19 (×3): 8 mg via SUBLINGUAL
  Filled 2016-02-18 (×3): qty 4

## 2016-02-18 MED ORDER — LORATADINE 10 MG PO TABS
10.0000 mg | ORAL_TABLET | Freq: Every day | ORAL | Status: DC
  Administered 2016-02-18 – 2016-02-19 (×2): 10 mg via ORAL
  Filled 2016-02-18 (×3): qty 1

## 2016-02-18 NOTE — Progress Notes (Addendum)
PULMONARY / CRITICAL CARE MEDICINE   Name: Evan Hines MRN: [id number] DOB: 1986/07/17    ADMISSION DATE:  02/17/2016 CONSULTATION DATE:  02/17/2016  REFERRING MD:  Dr. Mariea Clonts  CHIEF COMPLAINT:  Short of breath  SUBJECTIVE:  Breathing better.  Still has nasal congestion.  Not as much cough/wheeze.  VITAL SIGNS: BP 127/80 mmHg  Pulse 95  Temp(Src) 98.7 F (37.1 C) (Axillary)  Resp 18  Ht  (1.803 m)  Wt 166 lb 10.7 oz (75.6 kg)  BMI 23.26 kg/m2  SpO2 91%  INTAKE / OUTPUT: I/O last 3 completed shifts: In: 1819.3 [P.O.:60; I.V.:1609.3; IV Piggyback:150] Out: 650 [Urine:650]  PHYSICAL EXAMINATION: General:  alert Neuro:  Normal strength HEENT:  Boggy nasal mucosa Cardiovascular:  Regular, no murmur Lungs:  Diffuse b/l wheeze Abdomen:  Soft, non tender Musculoskeletal:  No edema Skin:  No rashes  LABS:  BMET  Recent Labs Lab 02/17/16 1356 02/18/16 0103  NA 139 135  K 3.5 3.9  CL 104 105  CO2 27 22  BUN 14 13  CREATININE 0.85 0.72  GLUCOSE 120* 155*    Electrolytes  Recent Labs Lab 02/17/16 1356 02/17/16 2242 02/18/16 0103  CALCIUM 9.3  --  9.1  MG  --  2.2  --   PHOS  --  2.1*  --     CBC  Recent Labs Lab 02/17/16 1357 02/18/16 0103  WBC 15.2* 12.7*  HGB 16.8 15.2  HCT 46.6 43.2  PLT 201 197    Coag's No results for input(s): APTT, INR in the last 168 hours.  Sepsis Markers  Recent Labs Lab 02/17/16 1928 02/17/16 2242 02/18/16 0103  LATICACIDVEN 2.7* 3.0*  --   PROCALCITON  --  <0.10 <0.10    ABG  Recent Labs Lab 02/17/16 1621 02/18/16 0458  PHART 7.369 7.415  PCO2ART 44.2 37.0  PO2ART 62.1* 74.5*    Liver Enzymes  Recent Labs Lab 02/17/16 1928  AST 25  ALT 35  ALKPHOS 89  BILITOT 1.1  ALBUMIN 5.0    Cardiac Enzymes  Recent Labs Lab 02/17/16 1928 02/18/16 0103 02/18/16 0646  TROPONINI <0.03 <0.03 <0.03    Glucose  Recent Labs Lab 02/17/16 2033 02/17/16 2317 02/18/16 0406  02/18/16 0810  GLUCAP 136* 176* 127* 130*    Imaging Dg Chest 2 View  02/17/2016  CLINICAL DATA:  Shortness of breath and wheezing EXAM: CHEST  2 VIEW COMPARISON:  None. FINDINGS: Cardiac shadow is within normal limits. The lungs are free of acute infiltrate or sizable effusion. Mild peribronchial cuffing is noted consistent with bronchitis/reactive airways disease. The upper abdomen is within normal limits. No bony abnormality is seen. IMPRESSION: Peribronchial thickening as described. Electronically Signed   By: Alcide Clever M.D.   On: 02/17/2016 14:14   Dg Chest Port 1 View  02/17/2016  CLINICAL DATA:  Smoker with shortness of breath today EXAM: PORTABLE CHEST 1 VIEW COMPARISON:  02/17/2016. FINDINGS: 1957 hours. The heart size and mediastinal contours are stable. Compared with the examination done earlier today, the lungs appear mildly hyperinflated. No airspace disease, edema, pleural effusion or pneumothorax identified. There are no acute osseous findings. IMPRESSION: Mild hyperinflation. No other significant changes from exam done earlier today. Electronically Signed   By: Carey Bullocks M.D.   On: 02/17/2016 20:14     STUDIES:   CULTURES: 5/25 Blood >>  ANTIBIOTICS: 5/25 Zithromax >> 5/25  5/25 Levaquin >> 5/26  SIGNIFICANT EVENTS: 5/25 Admit  LINES/TUBES:  DISCUSSION: 30 yo  male smoker with dyspnea, wheezing, nasal congestion, productive cough, hypoxia from asthmatic bronchitis.  Hx of polysubstance abuse.  He was tx for PNA on month prior to this admission.  ASSESSMENT / PLAN:  Acute hypoxic respiratory failure 2nd to asthmatic bronchitis >> has elevated eosinophils on peripheral smear. - oxygen to keep SpO2 > 92% - brovana, pulmicort, singulair, claritin, prn xopenx - continue solumedrol - procalcitonin negative, no infiltrate on CXR >> d/c levaquin - will need outpt pulmonary follow up  Rhinitis. - nasal irrigation, flonase, singulair, claritin  Tobacco abuse.   - nicotine patch  Polysubstance abuse. - subutex  Steroid induced hyperglycemia. - SSI while on high dose steroids  Updated pt's mother at bedside.  Coralyn HellingVineet Dallas Torok, MD [facility] 02/18/2016, 9:21 AM Pager:  [phone] After 3pm call: [phone]

## 2016-02-18 NOTE — Care Management Note (Signed)
Case Management Note  Patient Details  Name: Evan Hines MRN: [id number] Date of Birth: 11/10/1985  Subjective/Objective:           resp failure with o2 support         Action/Plan:Date:  Feb 18, 2016 Chart reviewed for concurrent status and case management needs. Will continue to follow patient for changes and needs: Expected discharge date: [id number] Marcelle SmilingRhonda Davis, BSN, [place]3, [place]M   [phone]   Expected Discharge Date:   (unknown)               Expected Discharge Plan:  Home/Self Care  In-House Referral:  NA  Discharge planning Services  CM Consult  Post Acute Care Choice:  NA Choice offered to:  NA  DME Arranged:    DME Agency:     HH Arranged:    HH Agency:     Status of Service:  In process, will continue to follow  Medicare Important Message Given:    Date Medicare IM Given:    Medicare IM give by:    Date Additional Medicare IM Given:    Additional Medicare Important Message give by:     If discussed at Long Length of Stay Meetings, dates discussed:    Additional Comments:  Golda AcreDavis, Rhonda Lynn, RN 02/18/2016, 10:24 AM

## 2016-02-18 NOTE — Clinical Documentation Improvement (Signed)
Internal Medicine  Abnormal Lab/Test Results:  Lactic acid level   3.0   2.7 (on admit)   Possible Clinical Conditions associated with below indicators    Lactic Acidosis  Other Condition  Cannot Clinically Determine    Evaluation :  Lactic Acid, plasma    Please exercise your independent, professional judgment when responding. A specific answer is not anticipated or expected.   Thank You,  Lavonda JumboLawanda J Hyacinth Marcelli Health Information Management [place] [phone]

## 2016-02-18 NOTE — Progress Notes (Signed)
PROGRESS NOTE  Evan PontJonathan Sledge  ZOX:[id number] DOB: 06/08/1986 DOA: 02/17/2016 PCP: De BlanchJEWELL, JAMES E, MD Outpatient Specialists:  Subjective: Seen with mother at bedside, still short of breath was minimally productive cough.  Brief Narrative:  30 year old male, smoker, admitted with shortness of breath, wheezing and hypoxia.  Assessment & Plan:   Principal Problem:   Acute respiratory failure with hypoxia (HCC) Active Problems:   Acute wheezy bronchitis   Tobacco abuse   Opiate dependence (HCC)/Suboxone Rx   Hypoxia   Bronchospasm   Acute hypoxic respiratory failure Presented with hypoxia of 87% on room air. D-dimer is negative, CXR is negative for infiltrates This is likely secondary to asthmatic bronchitis. Provided oxygen as needed, wean off of oxygen as tolerated.  Acute asthmatic bronchitis No previous history of asthma, but had similar problem previously for more than one episode. Procalcitonin is negative, Levaquin discontinued, no chest x-ray infiltrates. Treat with high-dose steroids, bronchodilators, mucolytics, antitussives and oxygen disease.  Tobacco abuse Patient counseled extensively.  Polysubstance abuse On Subutex, restarted, mother reported alcohol page drinking will follow closely.  Steroids induced hyperglycemia SSI while he was on high-dose steroids.   DVT prophylaxis: Lovenox Code Status: Full Code Family Communication: Discussed with the patient and mother at bedside. Disposition Plan:  Diet: Diet regular Room service appropriate?: Yes; Fluid consistency:: Thin  Consultants:   PCCM  Procedures:   None  Antimicrobials:   Levaquin, discontinued  Objective: Filed Vitals:   02/18/16 0500 02/18/16 0600 02/18/16 0707 02/18/16 0804  BP:  127/80    Pulse: 103 95    Temp:   98.7 F (37.1 C)   TempSrc:   Axillary   Resp: 19 18    Height:      Weight:      SpO2: 91% 93%  91%    Intake/Output Summary (Last 24 hours) at 02/18/16  0933 Last data filed at 02/18/16 0600  Gross per 24 hour  Intake 1819.28 ml  Output    650 ml  Net 1169.28 ml   Filed Weights   02/17/16 2000  Weight: 75.6 kg (166 lb 10.7 oz)    Examination: General exam: Appears calm and comfortable  Respiratory system: Clear to auscultation. Respiratory effort normal. Cardiovascular system: S1 & S2 heard, RRR. No JVD, murmurs, rubs, gallops or clicks. No pedal edema. Gastrointestinal system: Abdomen is nondistended, soft and nontender. No organomegaly or masses felt. Normal bowel sounds heard. Central nervous system: Alert and oriented. No focal neurological deficits. Extremities: Symmetric 5 x 5 power. Skin: No rashes, lesions or ulcers Psychiatry: Judgement and insight appear normal. Mood & affect appropriate.   Data Reviewed: I have personally reviewed following labs and imaging studies  CBC:  Recent Labs Lab 02/17/16 1357 02/18/16 0103  WBC 15.2* 12.7*  NEUTROABS 11.8*  --   HGB 16.8 15.2  HCT 46.6 43.2  MCV 85.2 85.7  PLT 201 197   Basic Metabolic Panel:  Recent Labs Lab 02/17/16 1356 02/17/16 2242 02/18/16 0103  NA 139  --  135  K 3.5  --  3.9  CL 104  --  105  CO2 27  --  22  GLUCOSE 120*  --  155*  BUN 14  --  13  CREATININE 0.85  --  0.72  CALCIUM 9.3  --  9.1  MG  --  2.2  --   PHOS  --  2.1*  --    GFR: Estimated Creatinine Clearance: 147.7 mL/min (by C-G formula based on Cr of  0.72). Liver Function Tests:  Recent Labs Lab 02/17/16 1928  AST 25  ALT 35  ALKPHOS 89  BILITOT 1.1  PROT 8.0  ALBUMIN 5.0   No results for input(s): LIPASE, AMYLASE in the last 168 hours. No results for input(s): AMMONIA in the last 168 hours. Coagulation Profile: No results for input(s): INR, PROTIME in the last 168 hours. Cardiac Enzymes:  Recent Labs Lab 02/17/16 1928 02/18/16 0103 02/18/16 0646  TROPONINI <0.03 <0.03 <0.03   BNP (last 3 results) No results for input(s): PROBNP in the last 8760  hours. HbA1C: No results for input(s): HGBA1C in the last 72 hours. CBG:  Recent Labs Lab 02/17/16 2033 02/17/16 2317 02/18/16 0406 02/18/16 0810  GLUCAP 136* 176* 127* 130*   Lipid Profile: No results for input(s): CHOL, HDL, LDLCALC, TRIG, CHOLHDL, LDLDIRECT in the last 72 hours. Thyroid Function Tests: No results for input(s): TSH, T4TOTAL, FREET4, T3FREE, THYROIDAB in the last 72 hours. Anemia Panel: No results for input(s): VITAMINB12, FOLATE, FERRITIN, TIBC, IRON, RETICCTPCT in the last 72 hours. Urine analysis:    Component Value Date/Time   COLORURINE YELLOW 02/18/2016 0348   APPEARANCEUR CLEAR 02/18/2016 0348   LABSPEC 1.021 02/18/2016 0348   PHURINE 6.5 02/18/2016 0348   GLUCOSEU NEGATIVE 02/18/2016 0348   HGBUR NEGATIVE 02/18/2016 0348   BILIRUBINUR NEGATIVE 02/18/2016 0348   KETONESUR NEGATIVE 02/18/2016 0348   PROTEINUR NEGATIVE 02/18/2016 0348   NITRITE NEGATIVE 02/18/2016 0348   LEUKOCYTESUR NEGATIVE 02/18/2016 0348   Sepsis Labs: @LABRCNTIP (procalcitonin:4,lacticidven:4)  ) Recent Results (from the past 240 hour(s))  MRSA PCR Screening     Status: None   Collection Time: 02/17/16  8:09 PM  Result Value Ref Range Status   MRSA by PCR NEGATIVE NEGATIVE Final    Comment:        The GeneXpert MRSA Assay (FDA approved for NASAL specimens only), is one component of a comprehensive MRSA colonization surveillance program. It is not intended to diagnose MRSA infection nor to guide or monitor treatment for MRSA infections.      Invalid input(s): PROCALCITONIN, LACTICACIDVEN   Radiology Studies: Dg Chest 2 View  02/17/2016  CLINICAL DATA:  Shortness of breath and wheezing EXAM: CHEST  2 VIEW COMPARISON:  None. FINDINGS: Cardiac shadow is within normal limits. The lungs are free of acute infiltrate or sizable effusion. Mild peribronchial cuffing is noted consistent with bronchitis/reactive airways disease. The upper abdomen is within normal limits. No  bony abnormality is seen. IMPRESSION: Peribronchial thickening as described. Electronically Signed   By: Alcide Clever M.D.   On: 02/17/2016 14:14   Dg Chest Port 1 View  02/17/2016  CLINICAL DATA:  Smoker with shortness of breath today EXAM: PORTABLE CHEST 1 VIEW COMPARISON:  02/17/2016. FINDINGS: 1957 hours. The heart size and mediastinal contours are stable. Compared with the examination done earlier today, the lungs appear mildly hyperinflated. No airspace disease, edema, pleural effusion or pneumothorax identified. There are no acute osseous findings. IMPRESSION: Mild hyperinflation. No other significant changes from exam done earlier today. Electronically Signed   By: Carey Bullocks M.D.   On: 02/17/2016 20:14        Scheduled Meds: . arformoterol  15 mcg Nebulization BID  . budesonide (PULMICORT) nebulizer solution  0.5 mg Nebulization BID  . buprenorphine  8 mg Sublingual BID  . fluticasone  2 spray Each Nare BID  . guaiFENesin  1,200 mg Oral BID  . heparin  5,000 Units Subcutaneous Q8H  . insulin aspart  0-20 Units Subcutaneous TID WC  . insulin aspart  0-5 Units Subcutaneous QHS  . levalbuterol  0.63 mg Nebulization Q6H  . loratadine  10 mg Oral Daily  . methylPREDNISolone (SOLU-MEDROL) injection  40 mg Intravenous Q6H  . montelukast  10 mg Oral QHS  . nicotine  14 mg Transdermal Daily  . senna  1 tablet Oral BID  . sodium chloride  2 spray Each Nare BID  . traZODone  100 mg Oral QHS   Continuous Infusions:       Time spent: 35 minutes    Analis Distler A, MD [facility] Pager [phone]  If 7PM-7AM, please contact night-coverage [email or web address] Password [facility] 02/18/2016, 9:33 AM

## 2016-02-19 DIAGNOSIS — J45901 Unspecified asthma with (acute) exacerbation: Secondary | ICD-10-CM

## 2016-02-19 LAB — CBC
HCT: 45.8 % (ref 39.0–52.0)
HEMOGLOBIN: 15.6 g/dL (ref 13.0–17.0)
MCH: 29.8 pg (ref 26.0–34.0)
MCHC: 34.1 g/dL (ref 30.0–36.0)
MCV: 87.4 fL (ref 78.0–100.0)
Platelets: 242 10*3/uL (ref 150–400)
RBC: 5.24 MIL/uL (ref 4.22–5.81)
RDW: 13.4 % (ref 11.5–15.5)
WBC: 15.9 10*3/uL — ABNORMAL HIGH (ref 4.0–10.5)

## 2016-02-19 LAB — BASIC METABOLIC PANEL
ANION GAP: 10 (ref 5–15)
BUN: 23 mg/dL — AB (ref 6–20)
CALCIUM: 9.4 mg/dL (ref 8.9–10.3)
CO2: 24 mmol/L (ref 22–32)
CREATININE: 0.82 mg/dL (ref 0.61–1.24)
Chloride: 104 mmol/L (ref 101–111)
GFR calc Af Amer: >60 mL/min (ref >60)
GFR calc non Af Amer: >60 mL/min (ref >60)
Glucose, Bld: 137 mg/dL — ABNORMAL HIGH (ref 65–99)
Potassium: 3.9 mmol/L (ref 3.5–5.1)
Sodium: 138 mmol/L (ref 135–145)

## 2016-02-19 LAB — PHOSPHORUS: PHOSPHORUS: 4.4 mg/dL (ref 2.5–4.6)

## 2016-02-19 LAB — MAGNESIUM: Magnesium: 2.5 mg/dL — ABNORMAL HIGH (ref 1.7–2.4)

## 2016-02-19 LAB — PROCALCITONIN

## 2016-02-19 LAB — GLUCOSE, CAPILLARY: Glucose-Capillary: 143 mg/dL — ABNORMAL HIGH (ref 65–99)

## 2016-02-19 MED ORDER — PREDNISONE 20 MG PO TABS: 20.0000 mg | ORAL_TABLET | Freq: Every day | ORAL | Status: DC

## 2016-02-19 MED ORDER — MOMETASONE FURO-FORMOTEROL FUM 200-5 MCG/ACT IN AERO
2.0000 | INHALATION_SPRAY | Freq: Two times a day (BID) | RESPIRATORY_TRACT | Status: DC
  Administered 2016-02-19: 2 via RESPIRATORY_TRACT
  Filled 2016-02-19: qty 8.8

## 2016-02-19 MED ORDER — PROAIR HFA 108 (90 BASE) MCG/ACT IN AERS: 2.0000 | INHALATION_SPRAY | RESPIRATORY_TRACT | Status: AC | PRN

## 2016-02-19 MED ORDER — PREDNISONE 10 MG PO TABS: 10.0000 mg | ORAL_TABLET | Freq: Every day | ORAL | Status: DC

## 2016-02-19 MED ORDER — PREDNISONE 20 MG PO TABS: 30.0000 mg | ORAL_TABLET | Freq: Every day | ORAL | Status: DC

## 2016-02-19 MED ORDER — PREDNISONE 20 MG PO TABS: 40.0000 mg | ORAL_TABLET | Freq: Every day | ORAL | Status: DC

## 2016-02-19 MED ORDER — AEROCHAMBER PLUS FLO-VU MEDIUM MISC
1.0000 | Freq: Once | Status: AC
  Administered 2016-02-19: 1
  Filled 2016-02-19: qty 1

## 2016-02-19 MED ORDER — MOMETASONE FURO-FORMOTEROL FUM 200-5 MCG/ACT IN AERO: 2.0000 | INHALATION_SPRAY | Freq: Two times a day (BID) | RESPIRATORY_TRACT | Status: DC

## 2016-02-19 MED ORDER — PREDNISONE 20 MG PO TABS
60.0000 mg | ORAL_TABLET | Freq: Every day | ORAL | Status: DC
  Administered 2016-02-19: 60 mg via ORAL
  Filled 2016-02-19: qty 3

## 2016-02-19 MED ORDER — PREDNISONE 10 MG PO TABS: ORAL_TABLET | ORAL | Status: DC

## 2016-02-19 NOTE — Discharge Summary (Addendum)
Physician Discharge Summary  Evan Hines WUJ:[id number] DOB: [birth year] DOA: 02/17/2016  PCP: De Blanch, MD  Admit date: 02/17/2016 Discharge date: 02/19/2016  Time spent: 40 minutes  Recommendations for Outpatient Follow-up:  1. Follow-up with [facility] in 1-2 weeks as outpatient.   Discharge Diagnoses:  Principal Problem:   Acute respiratory failure with hypoxia (HCC) Active Problems:   Acute wheezy bronchitis   Tobacco abuse   Opiate dependence (HCC)/Suboxone Rx   Hypoxia   Bronchospasm   Dyspnea   Discharge Condition: Stable  Diet recommendation: Heart healthy  Filed Weights   02/17/16 2000  Weight: 75.6 kg (166 lb 10.7 oz)    History of present illness:  Evan Hines is a 30 y.o. male, Evan Hines is an 30 y.o. male with PMH Relevant for polysubstance abuse and tobacco use disorder who presents with concerns about recurrent respiratory problems with hypoxia . Patient was seen urgent care for evaluation of SOB and wheezing, he was found to be hypoxic and transferred to the ED by EMS after receiving steroids and breathing treatments. Pt states that he was treated for pneumonia with antibiotics and steroids one month ago. Since then has felt well until 2 days ago when he began devloping some nasal congestion and progressively worsening productive cough. No significant sneezing or itching eyes. States that after his coughing fits he feels short of breath, and the shortness of breath is getting worse. States today it was to the point where he didn't know if he would be able to walk from his car in the parking lot to the urgent care lobby. Pt was apparently hypoxic to 87% on RA on arrival to [facility]. He has thus far been given a total of 7.5mg  albuterol neb, 0.5 mg atrovent, and 125 mg solu-medrol. He denies h/o asthma. Despite being on 4-5 L of oxygen via nasal canula in the ED his O2 sats remained at 88-90%. Cough is mostly dry. No leg pains no leg swelling no pleuritic  symptoms no chest pain. No prolonged immobilization, no recent surgery, no long travels. Overall no significant risk factor for DVT/PE identified. Patient's mother is at bedside, questions answered  Hospital Course:   Acute hypoxic respiratory failure Presented with hypoxia of 87% on room air. D-dimer is negative, CXR is negative for infiltrates. Initially admitted to the floor but transferred to stepdown because of labored breathing and respiratory distress. This is likely secondary to asthmatic bronchitis. This is resolved, on the day of discharge his sats was 94% with ambulation (on room air).  Acute asthmatic bronchitis No previous history of asthma, but had similar problem previously for more than one episode. Procalcitonin is negative, Levaquin discontinued, no chest x-ray infiltrates. Treated with steroids, bronchodilators, mucolytics, antitussives and oxygen disease. Seen by PCCM recommended Dulera, albuterol rescue inhaler and prednisone taper on discharge. Follow-up with PCCM.  Tobacco abuse Patient counseled extensively.  Polysubstance abuse On Subutex, restarted, mother reported alcohol page drinking will follow closely. Counseled extensively about drinking.  Steroids induced hyperglycemia SSI while he was in the hospital because of the steroids.  Elevated lactic acid Lactic acid was 2.7 on admission went up to 3.0, this is not related to sepsis, as his procalcitonin is less than 0.1. Lactic acid elevation is likely secondary to the acute asthma attack.  Procedures:  None  Consultations:  None  Discharge Exam: Filed Vitals:   02/19/16 0700 02/19/16 0800  BP: 123/34 134/89  Pulse: 81 90  Temp:  98.3 F (36.8 C)  Resp: 16 19  General: Alert and awake, oriented x3, not in any acute distress. HEENT: anicteric sclera, pupils reactive to light and accommodation, EOMI CVS: S1-S2 clear, no murmur rubs or gallops Chest: clear to auscultation bilaterally, no  wheezing, rales or rhonchi Abdomen: soft nontender, nondistended, normal bowel sounds, no organomegaly Extremities: no cyanosis, clubbing or edema noted bilaterally Neuro: Cranial nerves II-XII intact, no focal neurological deficits  Discharge Instructions   Discharge Instructions    Diet - low sodium heart healthy    Complete by:  As directed      Increase activity slowly    Complete by:  As directed           Current Discharge Medication List    START taking these medications   Details  mometasone-formoterol (DULERA) 200-5 MCG/ACT AERO Inhale 2 puffs into the lungs 2 (two) times daily. Qty: 1 Inhaler, Refills: 2    predniSONE (DELTASONE) 10 MG tablet Take 6 tablet PO daily for 3 days, then take 4 tablet PO daily for 3 days, then take 3 tablet PO daily for 3 days, then take 2 tablet PO daily for 2 days, then take 1 tablet PO daily for 2 days, then stop Qty: 45 tablet, Refills: 0      CONTINUE these medications which have CHANGED   Details  PROAIR HFA 108 (90 Base) MCG/ACT inhaler Inhale 2 puffs into the lungs every 4 (four) hours as needed for wheezing or shortness of breath. Qty: 1 Inhaler, Refills: 2      CONTINUE these medications which have NOT CHANGED   Details  albuterol (PROVENTIL) (2.5 MG/3ML) 0.083% nebulizer solution Inhale 3 mLs into the lungs every 6 (six) hours as needed for wheezing or shortness of breath.  Refills: 0    SUBOXONE 8-2 MG FILM Place 1 Film under the tongue 2 (two) times daily. Refills: 1    traZODone (DESYREL) 50 MG tablet Take 50 mg by mouth at bedtime as needed for sleep.  Refills: 0      STOP taking these medications     ibuprofen (ADVIL,MOTRIN) 200 MG tablet        No Known Allergies Follow-up Information    Follow up with Max Fickle, MD In 1 week.   Specialty:  Pulmonary Disease   Contact information:   [address] [place] [address] [phone]        The results of significant diagnostics from this  hospitalization (including imaging, microbiology, ancillary and laboratory) are listed below for reference.    Significant Diagnostic Studies: Dg Chest 2 View  02/17/2016  CLINICAL DATA:  Shortness of breath and wheezing EXAM: CHEST  2 VIEW COMPARISON:  None. FINDINGS: Cardiac shadow is within normal limits. The lungs are free of acute infiltrate or sizable effusion. Mild peribronchial cuffing is noted consistent with bronchitis/reactive airways disease. The upper abdomen is within normal limits. No bony abnormality is seen. IMPRESSION: Peribronchial thickening as described. Electronically Signed   By: Alcide Clever M.D.   On: 02/17/2016 14:14   Dg Chest Port 1 View  02/17/2016  CLINICAL DATA:  Smoker with shortness of breath today EXAM: PORTABLE CHEST 1 VIEW COMPARISON:  02/17/2016. FINDINGS: 1957 hours. The heart size and mediastinal contours are stable. Compared with the examination done earlier today, the lungs appear mildly hyperinflated. No airspace disease, edema, pleural effusion or pneumothorax identified. There are no acute osseous findings. IMPRESSION: Mild hyperinflation. No other significant changes from exam done earlier today. Electronically Signed  By: Carey BullocksWilliam  Veazey M.D.   On: 02/17/2016 20:14    Microbiology: Recent Results (from the past 240 hour(s))  MRSA PCR Screening     Status: None   Collection Time: 02/17/16  8:09 PM  Result Value Ref Range Status   MRSA by PCR NEGATIVE NEGATIVE Final    Comment:        The GeneXpert MRSA Assay (FDA approved for NASAL specimens only), is one component of a comprehensive MRSA colonization surveillance program. It is not intended to diagnose MRSA infection nor to guide or monitor treatment for MRSA infections.      Labs: Basic Metabolic Panel:  Recent Labs Lab 02/17/16 1356 02/17/16 2242 02/18/16 0103 02/19/16 0304  NA 139  --  135 138  K 3.5  --  3.9 3.9  CL 104  --  105 104  CO2 27  --  22 24  GLUCOSE 120*  --  155*  137*  BUN 14  --  13 23*  CREATININE 0.85  --  0.72 0.82  CALCIUM 9.3  --  9.1 9.4  MG  --  2.2  --  2.5*  PHOS  --  2.1*  --  4.4   Liver Function Tests:  Recent Labs Lab 02/17/16 1928  AST 25  ALT 35  ALKPHOS 89  BILITOT 1.1  PROT 8.0  ALBUMIN 5.0   No results for input(s): LIPASE, AMYLASE in the last 168 hours. No results for input(s): AMMONIA in the last 168 hours. CBC:  Recent Labs Lab 02/17/16 1357 02/18/16 0103 02/19/16 0304  WBC 15.2* 12.7* 15.9*  NEUTROABS 11.8*  --   --   HGB 16.8 15.2 15.6  HCT 46.6 43.2 45.8  MCV 85.2 85.7 87.4  PLT 201 197 242   Cardiac Enzymes:  Recent Labs Lab 02/17/16 1928 02/18/16 0103 02/18/16 0646  TROPONINI <0.03 <0.03 <0.03   BNP: BNP (last 3 results)  Recent Labs  02/17/16 1928  BNP 9.4    ProBNP (last 3 results) No results for input(s): PROBNP in the last 8760 hours.  CBG:  Recent Labs Lab 02/18/16 0810 02/18/16 1104 02/18/16 1548 02/18/16 1938 02/19/16 0732  GLUCAP 130* 122* 122* 160* 143*       Signed:  Clydia LlanoELMAHI,Fama Muenchow A MD.  [facility] 02/19/2016, 8:45 AM

## 2016-02-19 NOTE — Progress Notes (Signed)
Patient walked 380 ft (1 lap) around unit. HR remained around 110. Oxygen saturation remained around 94. Will continue to monitor. Will inform MD for D/C today.

## 2016-02-19 NOTE — Discharge Instructions (Signed)
Asthma Attack Prevention °While you may not be able to control the fact that you have asthma, you can take actions to prevent asthma attacks. The best way to prevent asthma attacks is to maintain good control of your asthma. You can achieve this by: °· Taking your medicines as directed. °· Avoiding things that can irritate your airways or make your asthma symptoms worse (asthma triggers). °· Keeping track of how well your asthma is controlled and of any changes in your symptoms. °· Responding quickly to worsening asthma symptoms (asthma attack). °· Seeking emergency care when it is needed. °WHAT ARE SOME WAYS TO PREVENT AN ASTHMA ATTACK? °Have a Plan °Work with your health care provider to create a written plan for managing and treating your asthma attacks (asthma action plan). This plan includes: °· A list of your asthma triggers and how you can avoid them. °· Information on when medicines should be taken and when their dosages should be changed. °· The use of a device that measures how well your lungs are working (peak flow meter). °Monitor Your Asthma °Use your peak flow meter and record your results in a journal every day. A drop in your peak flow numbers on one or more days may indicate the start of an asthma attack. This can happen even before you start to feel symptoms. You can prevent an asthma attack from getting worse by following the steps in your asthma action plan. °Avoid Asthma Triggers °Work with your asthma health care provider to find out what your asthma triggers are. This can be done by: °· Allergy testing. °· Keeping a journal that notes when asthma attacks occur and the factors that may have contributed to them. °· Determining if there are other medical conditions that are making your asthma worse. °Once you have determined your asthma triggers, take steps to avoid them. This may include avoiding excessive or prolonged exposure to: °· Dust. Have someone dust and vacuum your home for you once or  twice a week. Using a high-efficiency particulate arrestance (HEPA) vacuum is best. °· Smoke. This includes campfire smoke, forest fire smoke, and secondhand smoke from tobacco products. °· Pet dander. Avoid contact with animals that you know you are allergic to. °· Allergens from trees, grasses or pollens. Avoid spending a lot of time outdoors when pollen counts are high, and on very windy days. °· Very cold, dry, or humid air. °· Mold. °· Foods that contain high amounts of sulfites. °· Strong odors. °· Outdoor air pollutants, such as engine exhaust. °· Indoor air pollutants, such as aerosol sprays and fumes from household cleaners. °· Household pests, including dust mites and cockroaches, and pest droppings. °· Certain medicines, including NSAIDs. Always talk to your health care provider before stopping or starting any new medicines. °Medicines °Take over-the-counter and prescription medicines only as told by your health care provider. Many asthma attacks can be prevented by carefully following your medicine schedule. Taking your medicines correctly is especially important when you cannot avoid certain asthma triggers. °Act Quickly °If an asthma attack does happen, acting quickly can decrease how severe it is and how long it lasts. Take these steps:  °· Pay attention to your symptoms. If you are coughing, wheezing, or having difficulty breathing, do not wait to see if your symptoms go away on their own. Follow your asthma action plan. °· If you have followed your asthma action plan and your symptoms are not improving, call your health care provider or seek immediate medical care   at the nearest hospital. It is important to note how often you need to use your fast-acting rescue inhaler. If you are using your rescue inhaler more often, it may mean that your asthma is not under control. Adjusting your asthma treatment plan may help you to prevent future asthma attacks and help you to gain better control of your  condition. HOW CAN I PREVENT AN ASTHMA ATTACK WHEN I EXERCISE? Follow advice from your health care provider about whether you should use your fast-acting inhaler before exercising. Many people with asthma experience exercise-induced bronchoconstriction (EIB). This condition often worsens during vigorous exercise in cold, humid, or dry environments. Usually, people with EIB can stay very active by pre-treating with a fast-acting inhaler before exercising.   This information is not intended to replace advice given to you by your health care provider. Make sure you discuss any questions you have with your health care provider.   Document Released: 08/30/2009 Document Revised: 06/02/2015 Document Reviewed: 02/11/2015 Elsevier Interactive Patient Education 2016 [employer].  Respiratory failure is when your lungs are not working well and your breathing (respiratory) system fails. When respiratory failure occurs, it is difficult for your lungs to get enough oxygen, get rid of carbon dioxide, or both. Respiratory failure can be life threatening.  Respiratory failure can be acute or chronic. Acute respiratory failure is sudden, severe, and requires emergency medical treatment. Chronic respiratory failure is less severe, happens over time, and requires ongoing treatment.  WHAT ARE THE CAUSES OF ACUTE RESPIRATORY FAILURE?  Any problem affecting the heart or lungs can cause acute respiratory failure. Some of these causes include the following:  Chronic bronchitis and emphysema (COPD).   Blood clot going to a lung (pulmonary embolism).   Having water in the lungs caused by heart failure, lung injury, or infection (pulmonary edema).   Collapsed lung (pneumothorax).   Pneumonia.   Pulmonary fibrosis.   Obesity.   Asthma.   Heart failure.   Any type of trauma to the chest that can make breathing difficult.   Nerve or muscle diseases making chest movements difficult. HOW WILL MY ACUTE  RESPIRATORY FAILURE BE TREATED?  Treatment of acute respiratory failure depends on the cause of the respiratory failure. Usually, you will stay in the intensive care unit so your breathing can be watched closely. Treatment can include the following:  Oxygen. Oxygen can be delivered through the following:  Nasal cannula. This is small tubing that goes in your nose to give you oxygen.  Face mask. A face mask covers your nose and mouth to give you oxygen.  Medicine. Different medicines can be given to help with breathing. These can include:  Nebulizers. Nebulizers deliver medicines to open the air passages (bronchodilators). These medicines help to open or relax the airways in the lungs so you can breathe better. They can also help loosen mucus from your lungs.  Diuretics. Diuretic medicines can help you breathe better by getting rid of extra water in your body.  Steroids. Steroid medicines can help decrease swelling (inflammation) in your lungs.  Antibiotics.  Chest tube. If you have a collapsed lung (pneumothorax), a chest tube is placed to help reinflate the lung.  Noninvasive positive pressure ventilation (NPPV). This is a tight-fitting mask that goes over your nose and mouth. The mask has tubing that is attached to a machine. The machine blows air into the tubing, which helps to keep the tiny air sacs (alveoli) in your lungs open. This machine allows you to breathe  on your own.  Ventilator. A ventilator is a breathing machine. When on a ventilator, a breathing tube is put into the lungs. A ventilator is used when you can no longer breathe well enough on your own. You may have low oxygen levels or high carbon dioxide (CO2) levels in your blood. When you are on a ventilator, sedation and pain medicines are given to make you sleep so your lungs can heal. SEEK IMMEDIATE MEDICAL CARE IF:  You have shortness of breath (dyspnea) with or without activity.  You have rapid breathing  (tachypnea).  You are wheezing.  You are unable to say more than a few words without having to catch your breath.  You find it very difficult to function normally.  You have a fast heart rate.  You have a bluish color to your finger or toe nail beds.  You have confusion or drowsiness or both.   This information is not intended to replace advice given to you by your health care provider. Make sure you discuss any questions you have with your health care provider.   Document Released: 09/16/2013 Document Revised: 06/02/2015 Document Reviewed: 09/16/2013 Elsevier Interactive Patient Education 2016 Elsevier Inc.  Asthma, Adult Asthma is a condition of the lungs in which the airways tighten and narrow. Asthma can make it hard to breathe. Asthma cannot be cured, but medicine and lifestyle changes can help control it. Asthma may be started (triggered) by:  Animal skin flakes (dander).  Dust.  Cockroaches.  Pollen.  Mold.  Smoke.  Cleaning products.  Hair sprays or aerosol sprays.  Paint fumes or strong smells.  Cold air, weather changes, and winds.  Crying or laughing hard.  Stress.  Certain medicines or drugs.  Foods, such as dried fruit, potato chips, and sparkling grape juice.  Infections or conditions (colds, flu).  Exercise.  Certain medical conditions or diseases.  Exercise or tiring activities. HOME CARE   Take medicine as told by your doctor.  Use a peak flow meter as told by your doctor. A peak flow meter is a tool that measures how well the lungs are working.  Record and keep track of the peak flow meter's readings.  Understand and use the asthma action plan. An asthma action plan is a written plan for taking care of your asthma and treating your attacks.  To help prevent asthma attacks:  Do not smoke. Stay away from secondhand smoke.  Change your heating and air conditioning filter often.  Limit your use of fireplaces and wood  stoves.  Get rid of pests (such as roaches and mice) and their droppings.  Throw away plants if you see mold on them.  Clean your floors. Dust regularly. Use cleaning products that do not smell.  Have someone vacuum when you are not home. Use a vacuum cleaner with a HEPA filter if possible.  Replace carpet with wood, tile, or vinyl flooring. Carpet can trap animal skin flakes and dust.  Use allergy-proof pillows, mattress covers, and box spring covers.  Wash bed sheets and blankets every week in hot water and dry them in a dryer.  Use blankets that are made of polyester or cotton.  Clean bathrooms and kitchens with bleach. If possible, have someone repaint the walls in these rooms with mold-resistant paint. Keep out of the rooms that are being cleaned and painted.  Wash hands often. GET HELP IF:  You have make a whistling sound when breaking (wheeze), have shortness of breath, or have a cough  even if taking medicine to prevent attacks.  The colored mucus you cough up (sputum) is thicker than usual.  The colored mucus you cough up changes from clear or white to yellow, green, gray, or bloody.  You have problems from the medicine you are taking such as:  A rash.  Itching.  Swelling.  Trouble breathing.  You need reliever medicines more than 2-3 times a week.  Your peak flow measurement is still at 50-79% of your personal best after following the action plan for 1 hour.  You have a fever. GET HELP RIGHT AWAY IF:   You seem to be worse and are not responding to medicine during an asthma attack.  You are short of breath even at rest.  You get short of breath when doing very little activity.  You have trouble eating, drinking, or talking.  You have chest pain.  You have a fast heartbeat.  Your lips or fingernails start to turn blue.  You are light-headed, dizzy, or faint.  Your peak flow is less than 50% of your personal best.   This information is not  intended to replace advice given to you by your health care provider. Make sure you discuss any questions you have with your health care provider.   Document Released: 02/28/2008 Document Revised: 06/02/2015 Document Reviewed: 04/10/2013 Elsevier Interactive Patient Education [employer] Elsevier Inc.

## 2016-02-19 NOTE — Progress Notes (Signed)
[facility]  S: Feeling much better, less wheezing  O: Filed Vitals:   02/19/16 0500 02/19/16 0600 02/19/16 0700 02/19/16 0800  BP: 133/79 111/61 123/34   Pulse: 81 80 81   Temp:    98.3 F (36.8 C)  TempSrc:    Oral  Resp: 17 20 16    Height:      Weight:      SpO2: 95% 94% 94%    RA  Gen: well appearing, resting comfortably in bed, speaking in full sentences HENT: OP clear, neck supple PULM: wheezing bilaterally, good air movement, normal effort, no accessory muscle use CV: RRR, no mgr, trace edema GI: BS+, soft, nontender Derm: no cyanosis or rash Psyche: normal mood and affect  CXR images personally reviewed: Hyperinflation, no infiltrate  CBC    Component Value Date/Time   WBC 15.9* 02/19/2016 0304   RBC 5.24 02/19/2016 0304   HGB 15.6 02/19/2016 0304   HCT 45.8 02/19/2016 0304   PLT 242 02/19/2016 0304   MCV 87.4 02/19/2016 0304   MCH 29.8 02/19/2016 0304   MCHC 34.1 02/19/2016 0304   RDW 13.4 02/19/2016 0304   LYMPHSABS 1.6 02/17/2016 1357   MONOABS 0.9 02/17/2016 1357   EOSABS 0.9* 02/17/2016 1357   BASOSABS 0.1 02/17/2016 1357    Impression: Asthma exacerbation Tobacco abuse History of narcotic abuse  Discussion: He has improved significantly. He is still wheezing somewhat but air movement has dramatically improved. He needs follow-up with pulmonary medicine.  Plan: Change Brovana and Pul[facility]ort to [facility], use high-dose with spacer Use albuterol as needed for shortness of breath Prednisone taper at home: I will prescribe in-hospital dose which can be used for home taper Outpatient follow-up with [facility] I counseled the patient and his mother today on the importance of tobacco cessation as well as appropriate technique and importance of use of controller medication. I advised extensively on the roles of controller medications versus rescue medications.  Greater than 50% of this time was spent face-to-face, 38 minute  visit  Heber CarolinaBrent McQuaid, MD [facility] Pager: [phone] Cell: [phone] After 3pm or if no response, call [phone]

## 2016-02-22 ENCOUNTER — Telehealth: Payer: Self-pay | Admitting: *Deleted

## 2016-02-22 LAB — CULTURE, BLOOD (ROUTINE X 2)
Culture: NO GROWTH
Culture: NO GROWTH

## 2016-02-22 NOTE — Telephone Encounter (Signed)
Message  Received: 3 days ago    Evan Leashouglas B McQuaid, MD  [name], CMA; [name], CMA; [name], MD           Hi Guys,   Can we get this guy in to see Evan CottaSood, me, or one of the NP's for hospital follow up in 1-2 weeks? Asthma.    Thanks  Evan BroodBrent     Patient needs a HFU around 02/24/2016 with [facility] or one of the NP's [facility] and [facility] have openings the week of 02/24/16.   LM for patient to schedule appt.

## 2016-02-22 NOTE — Telephone Encounter (Signed)
-----   Message from Lupita Leashouglas B McQuaid, MD sent at 02/19/2016  8:02 AM EDT ----- Hi Guys,  Can we get this guy in to see Craige CottaSood, me, or one of the NP's for hospital follow up in 1-2 weeks? Asthma.    Thanks [employer] Chase & CoBrent

## 2016-02-25 NOTE — Telephone Encounter (Signed)
Patient is scheduled for 02/28/16 at 12pm with Kandice RobinsonsSarah Groce, NP Nothing further needed.

## 2016-02-28 ENCOUNTER — Other Ambulatory Visit (INDEPENDENT_AMBULATORY_CARE_PROVIDER_SITE_OTHER): Payer: BLUE CROSS/BLUE SHIELD

## 2016-02-28 ENCOUNTER — Ambulatory Visit (INDEPENDENT_AMBULATORY_CARE_PROVIDER_SITE_OTHER): Payer: BLUE CROSS/BLUE SHIELD | Admitting: Acute Care

## 2016-02-28 ENCOUNTER — Encounter: Payer: Self-pay | Admitting: Acute Care

## 2016-02-28 VITALS — BP 174/66 | HR 106 | Ht 71.0 in | Wt 184.2 lb

## 2016-02-28 DIAGNOSIS — R0600 Dyspnea, unspecified: Secondary | ICD-10-CM

## 2016-02-28 DIAGNOSIS — J9601 Acute respiratory failure with hypoxia: Secondary | ICD-10-CM

## 2016-02-28 DIAGNOSIS — J209 Acute bronchitis, unspecified: Secondary | ICD-10-CM

## 2016-02-28 DIAGNOSIS — Z720 Tobacco use: Secondary | ICD-10-CM

## 2016-02-28 LAB — IGA: IgA: 166 mg/dL (ref 68–378)

## 2016-02-28 NOTE — Assessment & Plan Note (Addendum)
Resolving post hospitalization. Suspect may have a GERD component Plan: Please continue using your Dulera 2 puffs twice daily. Remember to rinse your mouth with water or brush your teeth after use. Please add claritin or any other non-sedating anti histamine once daily for allergies. Add Flonase 2 puffs in each nostril once daily ( Fluticasone) Add Zantac ( Ranitidine)150 mg once daily at bedtime. We will do the allergy blood work today. We will call you with results. If necessary we will refer you for allergy testing. Please have your Pulmonary Function Tests [facility] our [facility].  We will determine if we need to repeat them, based on how long ago they were done. Continue to work on quitting smoking. This is the single most powerful action you can take to decrease your risk of pulmonary problems, cardiac problems and stroke. Have CBC drawn by PCP approx 2 weeks after completing the prednisone taper to assure WBC has returned to normal. Follow up appointment in 8 weeks with Dr. Kendrick FriesMcQuaid. Please contact office for sooner follow up if symptoms do not improve or worsen or seek emergency care

## 2016-02-28 NOTE — Assessment & Plan Note (Signed)
Resolved Plan: Continue Dulera 2 puffs twice daily Continue Albuterol rescue inhaler as needed for shortness of breath and wheezing up to every 6 hours. Follow up in 8 weeks with Dr. Kendrick FriesMcQuaid Please contact office for sooner follow up if symptoms do not improve or worsen or seek emergency care

## 2016-02-28 NOTE — Progress Notes (Signed)
History of Present Illness Evan Hines is a 30 y.o. male current smoker with history of hypoxia 2/2 asthmatic bronchitis. He has a history of polysubstance abuse and is taking Subutex at present.   6/5/2017Hospital Follow Up: Presents to the office today for follow up of hospital stay for acute hypoxic respiratory failure. He was treated with IV steroild, scheduled BD's, mucolytic's, antitussives and oxygen. He remains on his pred taper, and is using his Dulera twice daily. He is using his albuterol inhaler approximately 2 times daily. His mother,who is here with him, is concerned that he may be using it too much.He states that he feels much better. He is coughing up some white to light yellow secretions.He denies fever, chest pain, orthopnea, hemoptysis, calf or leg pain. He states he has quit smoking, but his eyes are red and he appears very distracted. He has been started on Subutex by another provider, and was again counseled on absolute avoidance of alcohol.We also spent time discussing tobacco cessation.He has not been sleeping well. He had a recent diagnosis of pneumonia about 1 month prior to this admission.  Significant Events/ Procedures:  Hospital Admission:   Admit date: 02/17/2016 Discharge date: 02/19/2016 Discharge Diagnoses:  Principal Problem:  Acute respiratory failure with hypoxia [facility]) Active Problems:  Acute wheezy bronchitis  Tobacco abuse  Opiate dependence (HCC)/Suboxone Rx  Hypoxia  Bronchospasm  Dyspnea   Tests:  CXR 02/17/16  IMPRESSION: Mild hyperinflation. No other significant changes from exam done earlier today.  Past medical hx No past medical history on file.   Past surgical hx, Family hx, Social hx all reviewed.  Current Outpatient Prescriptions on File Prior to Visit  Medication Sig  . albuterol (PROVENTIL) (2.5 MG/3ML) 0.083% nebulizer solution Inhale 3 mLs into the lungs every 6 (six) hours as needed for wheezing or shortness of  breath.   . mometasone-formoterol (DULERA) 200-5 MCG/ACT AERO Inhale 2 puffs into the lungs 2 (two) times daily.  . predniSONE (DELTASONE) 10 MG tablet Take 6 tablet PO daily for 3 days, then take 4 tablet PO daily for 3 days, then take 3 tablet PO daily for 3 days, then take 2 tablet PO daily for 2 days, then take 1 tablet PO daily for 2 days, then stop  . PROAIR HFA 108 (90 Base) MCG/ACT inhaler Inhale 2 puffs into the lungs every 4 (four) hours as needed for wheezing or shortness of breath.  . SUBOXONE 8-2 MG FILM Place 1 Film under the tongue 2 (two) times daily.  . traZODone (DESYREL) 50 MG tablet Take 50 mg by mouth at bedtime as needed for sleep.    No current facility-administered medications on file prior to visit.     No Known Allergies  Review Of Systems:  Constitutional:   No  weight loss, night sweats,  Fevers, chills, fatigue, or  lassitude.  HEENT:   No headaches,  Difficulty swallowing,  Tooth/dental problems, or  Sore throat,                No sneezing, itching, ear ache, nasal congestion, post nasal drip,   CV:  No chest pain,  Orthopnea, PND, swelling in lower extremities, anasarca, dizziness, palpitations, syncope.   GI  No heartburn, indigestion, abdominal pain, nausea, vomiting, diarrhea, change in bowel habits, loss of appetite, bloody stools.   Resp: No shortness of breath with exertion or at rest.  + excess mucus, + productive cough,  No non-productive cough,  No coughing up of blood.  No change in color of mucus. + wheezing.  No chest wall deformity  Skin: no rash or lesions.  GU: no dysuria, change in color of urine, no urgency or frequency.  No flank pain, no hematuria   MS:  No joint pain or swelling.  No decreased range of motion.  No back pain.  Psych:  No change in mood or affect. No depression or anxiety.  No memory loss.   Vital Signs BP 174/66 mmHg  Pulse 106  Ht 5\' 11"  (1.803 m)  Wt 184 lb 3.2 oz (83.553 kg)  BMI 25.70 kg/m2  SpO2  97%   Physical Exam:  General- No distress,  A&Ox3, anxious ENT: No sinus tenderness, TM clear, pale nasal mucosa, no oral exudate,no post nasal drip, no LAN Cardiac: S1, S2, regular rate and rhythm, no murmur Chest: + wheeze primarily upper airways,/ no rales/ dullness; no accessory muscle use, no nasal flaring, no sternal retractions Abd.: Soft Non-tender Ext: No clubbing cyanosis, edema Neuro:  normal strength Skin: No rashes, warm and dry Psych: normal mood and behavior   Assessment/Plan  Acute respiratory failure with hypoxia (HCC) Resolved Plan: Continue Dulera 2 puffs twice daily Continue Albuterol rescue inhaler as needed for shortness of breath and wheezing up to every 6 hours. Follow up in 8 weeks with Dr. Kendrick FriesMcQuaid Please contact office for sooner follow up if symptoms do not improve or worsen or seek emergency care   Acute wheezy bronchitis Resolving post hospitalization. Suspect may have a GERD component Plan: Please continue using your Dulera 2 puffs twice daily. Remember to rinse your mouth with water or brush your teeth after use. Please add claritin or any other non-sedating anti histamine once daily for allergies. Add Flonase 2 puffs in each nostril once daily ( Fluticasone) Add Zantac ( Ranitidine)150 mg once daily at bedtime. We will do the allergy blood work today. We will call you with results. If necessary we will refer you for allergy testing. Please have your Pulmonary Function Tests [facility] to our [facility].  We will determine if we need to repeat them, based on how long ago they were done. Continue to work on quitting smoking. This is the single most powerful action you can take to decrease your risk of pulmonary problems, cardiac problems and stroke. Have CBC drawn by PCP approx 2 weeks after completing the prednisone taper to assure WBC has returned to normal. Follow up appointment in 8 weeks with Dr. Kendrick FriesMcQuaid. Please contact office for  sooner follow up if symptoms do not improve or worsen or seek emergency care      Tobacco abuse Has not smoked in 2 weeks per patient Plan: Gave him the " Be stronger than your excuses" card. Strong counseling regarding the effects of cigarette smoking on his pulmonary health and as a trigger for his breathing problems. Follow up with Dr.  Kendrick FriesMcQuaid in 8 weeks Please contact office for sooner follow up if symptoms do not improve or worsen or seek emergency care      Bevelyn NgoSarah F Jacinda Kanady, NP 02/28/2016  1:33 PM

## 2016-02-28 NOTE — Assessment & Plan Note (Signed)
Has not smoked in 2 weeks per patient Plan: Gave him the " Be stronger than your excuses" card. Strong counseling regarding the effects of cigarette smoking on his pulmonary health and as a trigger for his breathing problems. Follow up with Dr.  Kendrick FriesMcQuaid in 8 weeks Please contact office for sooner follow up if symptoms do not improve or worsen or seek emergency care

## 2016-02-28 NOTE — Patient Instructions (Addendum)
It is nice to meet you today. Please continue using your Dulera 2 puffs twice daily. Remember to rinse your mouth with water or brush your teeth after use. Please add claritin or any other non-sedating anti histamine once daily for allergies. Add Flonase 2 puffs in each nostril once daily ( Fluticasone) Add Zantac ( Ranitidine)150 mg once daily at bedtime. We will do the allergy blood work today. We will call you with results. If necessary we will refer you for allergy testing. Please have your Pulmonary Function Tests sent to our [facility].  We will determine if we need to repeat them, based on how long ago they were done. Continue to work on quitting smoking. This is the single most powerful action you can take to decrease your risk of pulmonary problems, cardiac problems and stroke. Follow up appointment in 8 weeks with Dr. Kendrick FriesMcQuaid. Please contact office for sooner follow up if symptoms do not improve or worsen or seek emergency care

## 2016-02-28 NOTE — Progress Notes (Signed)
Chart reviewed, I agree with this plan of care 

## 2016-02-29 LAB — RESPIRATORY ALLERGY PROFILE REGION II ~~LOC~~
Allergen, Cedar tree, t12: <0.1 kU/L
Allergen, Comm Silver Birch, t9: <0.1 kU/L
Allergen, Cottonwood, t14: <0.1 kU/L
Allergen, Mouse Urine Protein, e78: <0.1 kU/L
Allergen, Oak,t7: <0.1 kU/L
Bermuda Grass: <0.1 kU/L
Cat Dander: <0.1 kU/L
Elm IgE: <0.1 kU/L
IgE (Immunoglobulin E), Serum: 28 kU/L (ref <115)
Pecan/Hickory Tree IgE: <0.1 kU/L
Timothy Grass: <0.1 kU/L

## 2016-02-29 LAB — IGM: IgM, Serum: 138 mg/dL (ref 48–271)

## 2016-02-29 LAB — IGG: IgG (Immunoglobin G), Serum: 782 mg/dL (ref 694–1618)

## 2016-03-01 ENCOUNTER — Telehealth: Payer: Self-pay | Admitting: Pulmonary Disease

## 2016-03-01 NOTE — Telephone Encounter (Signed)
lmtcb x1 for pt's mother. 

## 2016-03-03 NOTE — Telephone Encounter (Signed)
PFT results received and placed in SG's look at.  Will route msg to SG so she is aware.

## 2016-03-03 NOTE — Telephone Encounter (Signed)
Pt was seen by SG on 02/28/16 with the following instructions:  Patient Instructions     It is nice to meet you today. Please continue using your Dulera 2 puffs twice daily. Remember to rinse your mouth with water or brush your teeth after use. Please add claritin or any other non-sedating anti histamine once daily for allergies. Add Flonase 2 puffs in each nostril once daily ( Fluticasone) Add Zantac ( Ranitidine)150 mg once daily at bedtime. We will do the allergy blood work today. We will call you with results. If necessary we will refer you for allergy testing. Please have your Pulmonary Function Tests sent to our [facility] from [facility].  We will determine if we need to repeat them, based on how long ago they were done. Continue to work on quitting smoking. This is the single most powerful action you can take to decrease your risk of pulmonary problems, cardiac problems and stroke. Follow up appointment in 8 weeks with Dr. Kendrick FriesMcQuaid. Please contact office for sooner follow up if symptoms do not improve or worsen or seek emergency care   -------- Spoke with Selena BattenKim with [facility].  She will fax results to front fax.  Will await fax.

## 2016-03-07 NOTE — Telephone Encounter (Signed)
I have looked in all of my folders with Katie's help and we cannot find these. Please call [facility] and have them refax. Also, perhaps we should let the patient know i have not had them to review so he knows we are following up. Thanks

## 2016-03-07 NOTE — Telephone Encounter (Signed)
SG have you received these results yet? Please advise. Thanks!

## 2016-03-07 NOTE — Telephone Encounter (Signed)
Spoke with [facility] and req PFT's  Last PFT's done in 2014  She is going to fax them today  [facility] Lupita LeashDonna, pt's mother so we can let her know we are working in this  Will hold in triage while awaiting results

## 2016-03-08 NOTE — Telephone Encounter (Signed)
Records received and given to SG for review.

## 2016-03-08 NOTE — Telephone Encounter (Signed)
I have not seen these PFT's. I contacted [facility] and they are re-faxing to my attention. I will give to Maralyn SagoSarah once received.

## 2016-03-08 NOTE — Telephone Encounter (Signed)
I checked [name]'s lookat at nothing I spoke with Maralyn SagoSarah and she asked that I check with Sheena, b/c yesterday she had some things for Maralyn SagoSarah to look at? Have you seen these PFT's Sheena? Please advise thanks!

## 2016-03-09 NOTE — Telephone Encounter (Signed)
Evan Hines, where are the PFT's? Thanks

## 2016-03-09 NOTE — Telephone Encounter (Signed)
I have them and placed them on your desk for review

## 2016-03-13 NOTE — Telephone Encounter (Signed)
SG please advise on results.  Thanks!

## 2016-03-17 NOTE — Telephone Encounter (Signed)
[name], please advise on PFT results.  Thanks.

## 2016-03-21 ENCOUNTER — Telehealth: Payer: Self-pay | Admitting: Acute Care

## 2016-03-21 NOTE — Telephone Encounter (Signed)
I called to go over pulmonary function tests that the patient had sent from [facility] that were done a few years ago.. There was no answer.I have left a message and contact information. I will try to call again in the morning. ( 03/22/2016)

## 2016-03-21 NOTE — Telephone Encounter (Signed)
[name]- please advise. °

## 2016-03-22 NOTE — Telephone Encounter (Signed)
No, I will call first thing in the morning. Thanks

## 2016-03-22 NOTE — Telephone Encounter (Signed)
Evan Hines, did you contact this patient today?  Please advise.

## 2016-03-23 ENCOUNTER — Telehealth: Payer: Self-pay | Admitting: Acute Care

## 2016-03-23 NOTE — Telephone Encounter (Signed)
I called again today to discuss the PFT results that were sent from [facility]. The PFT's are normal, but they are over 28 years old. There was no answer. I have left a message with contact information requesting that he return my call.

## 2016-03-24 NOTE — Telephone Encounter (Signed)
[name] please advise on PFT results.

## 2016-03-27 NOTE — Telephone Encounter (Signed)
Bevelyn NgoSarah F Groce, NP at 03/23/2016 8:32 AM     Status: Signed       Expand All Collapse All   I called again today to discuss the PFT results that were [facility]. The PFT's are normal, but they are over 28 years old. There was no answer. I have left a message with contact information requesting that he return my call.

## 2016-03-29 NOTE — Telephone Encounter (Signed)
Spoke with pt's mother. She is aware of results. Another message is open regarding this same matter. Message will be closed.

## 2016-03-29 NOTE — Telephone Encounter (Signed)
Please call Mom and ask if Christiane HaJonathan is better, or still having problems. If he is not better, please schedule PFT's prior to next appointment with Dr. Kendrick FriesMcQuaid. Thanks.

## 2016-03-29 NOTE — Telephone Encounter (Signed)
We have the pt's mother listed as an emergency contact only. She will need to added to pt's DPR. Attempted to contact pt's mother back, her voicemail is full. Attempted to contact pt, lmtcb x1 for pt.

## 2016-03-29 NOTE — Telephone Encounter (Signed)
Spoke with pt's mother. She is aware of PFT results. They are wanting to know if pt needs to repeat these. States that they are needing forms signed for a flight the pt missed while he was in ICU. Lupita LeashDonna is going to fax this over to us.  SG - please advise if pt will need repeat PFTs. Thanks.

## 2016-03-30 NOTE — Telephone Encounter (Signed)
lmtcb x1 for pt's mother. lmtcb x2 for pt.

## 2016-03-31 NOTE — Telephone Encounter (Signed)
lmtcb x2 for pt's mother. lmtcb x3 for pt.  We have tried contacting the pt and his mother multiple times with no response. Message will be closed at this time.

## 2016-04-12 ENCOUNTER — Telehealth: Payer: Self-pay | Admitting: Acute Care

## 2016-04-12 NOTE — Telephone Encounter (Signed)
ATC pt's mother. Voicemail has different name than listed in message so I LMTCB.  Form can be dropped off or faxed. Please advise.

## 2016-04-13 NOTE — Telephone Encounter (Signed)
lmomtcb x 2 for pts mother.

## 2016-04-17 NOTE — Telephone Encounter (Signed)
Forms received Will go ahead and place in BQ's lookat Did call pt's mother and LM asking when the form is due so that we can be sure to have it completed in the necessary time-frame.  Will forward to BQ and Morrie Sheldon to ensure follow up.

## 2016-04-17 NOTE — Telephone Encounter (Signed)
LMOVM for pt's mother per her request that she can drop off from to be signed at our front desk and we will have Maralyn Sago or Dr Kendrick Fries review form.

## 2016-04-17 NOTE — Telephone Encounter (Signed)
Called spoke with pt's mother. She states that she faxed over forms for BQ to sign to the ATT: Sheena. I explained to her that I checked BQ's look at, spoke with Zenon Mayo and Morrie Sheldon. Neither ladies have received the forms. She states she is refaxing the forms to ATT: Morrie Sheldon. She voiced understanding and had no further questions. Will hold in triage till fax has been received.

## 2016-04-17 NOTE — Telephone Encounter (Signed)
Mother called back. She is faxing form over with Att: sheena for [place] to fill out and fax back to her.

## 2016-04-17 NOTE — Telephone Encounter (Signed)
I have placed these forms in BQ folder upfront to be passed out at the end of the day

## 2016-04-18 NOTE — Telephone Encounter (Signed)
Forms have been given to BQ to fill out

## 2016-04-19 NOTE — Telephone Encounter (Signed)
Forms have been filled out and faxed back to [phone] as requested.  lmtcb X1 for pt's mother to make aware.

## 2016-04-20 NOTE — Telephone Encounter (Signed)
lmtcb X2 for pt's mother.   

## 2016-04-21 NOTE — Telephone Encounter (Signed)
pts mother returned call. I let her know that it had been faxed and she confirmed that the place had it. Just didn't want anyone to have to call again since it was done. Can be closed.

## 2016-04-25 ENCOUNTER — Ambulatory Visit (INDEPENDENT_AMBULATORY_CARE_PROVIDER_SITE_OTHER): Payer: BLUE CROSS/BLUE SHIELD | Admitting: Pulmonary Disease

## 2016-04-25 ENCOUNTER — Encounter: Payer: Self-pay | Admitting: Pulmonary Disease

## 2016-04-25 DIAGNOSIS — J4540 Moderate persistent asthma, uncomplicated: Secondary | ICD-10-CM | POA: Diagnosis not present

## 2016-04-25 DIAGNOSIS — Z720 Tobacco use: Secondary | ICD-10-CM | POA: Diagnosis not present

## 2016-04-25 MED ORDER — BECLOMETHASONE DIPROPIONATE 80 MCG/ACT IN AERS: 2.0000 | INHALATION_SPRAY | Freq: Two times a day (BID) | RESPIRATORY_TRACT | 6 refills | Status: AC

## 2016-04-25 NOTE — Assessment & Plan Note (Signed)
Advised at length to quit 

## 2016-04-25 NOTE — Assessment & Plan Note (Signed)
He has moderate persistent asthma based on his frequent ER visits and his hospitalization this year. However, since starting a controller medicine with an inhaled corticosteroid has been doing very well. Based on his lack of symptoms of the last several months he does not need to take an inhaled corticosteroid and long-acting beta agonist combination anymore. However, he should continue taking an inhaled corticosteroid.  Plan: Change Dulera to Qvar 80 g 2 puffs twice a day Follow-up 4 months, consider decreasing dose of Qvar at that time Advised at length to quit smoking Flu shot in the fall

## 2016-04-25 NOTE — Progress Notes (Signed)
   Subjective:    Patient ID: Evan Hines, male    DOB: 17-Feb-1986, 30 y.o.   MRN: [id number]  Synopsis: First seen by [place] [facility] will hospitalized for an asthma exacerbation in the spring of 2017.  His first round of wheezing occurred 2014 and [facility]sa followed by multiple ER and urgent care visits.  He    HPI Chief Complaint  Patient presents with  . Follow-up    Pt seen by SG after hospital stay, pt has no breathing complaints today.     Evan Hines says that he has been doing really well since the last visit.  He has not had flare ups or wheezing.    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    He has not had use albuterol.  He continues to smoke one cigarette every other day.  No past medical history on file.    Review of Systems     Objective:   Physical Exam Vitals:   04/25/16 0946  BP: 132/76  Pulse: 70  SpO2: 98%  Weight: 176 lb 6.4 oz (80 kg)  Height: 5\' 11"  (1.803 m)    Gen: well appearing HENT: OP clear, TM's clear, neck supple PULM: CTA B, normal percussion CV: RRR, no mgr, trace edema GI: BS+, soft, nontender Derm: no cyanosis or rash Psyche: normal mood and affect       Assessment & Plan:  Moderate persistent asthma He has moderate persistent asthma based on his frequent ER visits and his hospitalization this year. However, since starting a controller medicine with an inhaled corticosteroid has been doing very well. Based on his lack of symptoms of the last several months he does not need to take an inhaled corticosteroid and long-acting beta agonist combination anymore. However, he should continue taking an inhaled corticosteroid.  Plan: Change Dulera to Qvar 80 g 2 puffs twice a day Follow-up 4 months, consider decreasing dose of Qvar at that time Advised at length to quit smoking Flu shot in the fall  Tobacco abuse Advised at length to  quit    Current Outpatient Prescriptions:  .  albuterol (PROVENTIL) (2.5 MG/3ML) 0.083% nebulizer solution, Inhale 3 mLs into the lungs every 6 (six) hours as needed for wheezing or shortness of breath. , Disp: , Rfl: 0 .  mometasone-formoterol (DULERA) 200-5 MCG/ACT AERO, Inhale 2 puffs into the lungs 2 (two) times daily., Disp: 1 Inhaler, Rfl: 2 .  predniSONE (DELTASONE) 10 MG tablet, Take 6 tablet PO daily for 3 days, then take 4 tablet PO daily for 3 days, then take 3 tablet PO daily for 3 days, then take 2 tablet PO daily for 2 days, then take 1 tablet PO daily for 2 days, then stop, Disp: 45 tablet, Rfl: 0 .  PROAIR HFA 108 (90 Base) MCG/ACT inhaler, Inhale 2 puffs into the lungs every 4 (four) hours as needed for wheezing or shortness of breath., Disp: 1 Inhaler, Rfl: 2 .  SUBOXONE 8-2 MG FILM, Place 1 Film under the tongue 2 (two) times daily., Disp: , Rfl: 1 .  traZODone (DESYREL) 50 MG tablet, Take 50 mg by mouth at bedtime as needed for sleep. , Disp: , Rfl: 0

## 2016-04-25 NOTE — Patient Instructions (Signed)
Stop smoking Start taking QVar two puffs twice a day no matter what Get a flu shot in the fall We will see you back in 4 months or sooner if needed

## 2016-08-30 ENCOUNTER — Ambulatory Visit: Payer: BLUE CROSS/BLUE SHIELD | Admitting: Pulmonary Disease

## 2017-07-27 ENCOUNTER — Telehealth: Payer: Self-pay | Admitting: *Deleted

## 2017-07-27 ENCOUNTER — Ambulatory Visit (INDEPENDENT_AMBULATORY_CARE_PROVIDER_SITE_OTHER): Payer: BLUE CROSS/BLUE SHIELD | Admitting: Physician Assistant

## 2017-07-27 ENCOUNTER — Encounter: Payer: Self-pay | Admitting: Physician Assistant

## 2017-07-27 VITALS — BP 116/80 | HR 116 | Temp 98.8°F | Resp 16 | Ht 72.0 in | Wt 198.2 lb

## 2017-07-27 DIAGNOSIS — L0501 Pilonidal cyst with abscess: Secondary | ICD-10-CM

## 2017-07-27 DIAGNOSIS — M7918 Myalgia, other site: Secondary | ICD-10-CM | POA: Diagnosis not present

## 2017-07-27 MED ORDER — DOXYCYCLINE HYCLATE 100 MG PO TABS: 100.0000 mg | ORAL_TABLET | Freq: Two times a day (BID) | ORAL | 0 refills | Status: DC

## 2017-07-27 NOTE — Telephone Encounter (Signed)
Called the patient to advise him that he left without getting his work note. He does not have a voice mail set up. Note will be placed in pick up box up front at 102. Pt is due to come in tomorrow for follow up visit on 07/28/17.

## 2017-07-27 NOTE — Progress Notes (Signed)
Evan Hines  MRN: [id number] DOB: 05/30/1986  PCP: Patient, No Pcp Per  Subjective:  Pt is a 31 year old male who presents to clinic for tailbone swelling x 1 day. Endorses worsening pain with sitting and walking. +drinages. He had an abscess in this area about 8 years ago and had to have surgery.  Denies fever, chills, rectal pain, pain with defecation, abdominal pain.   Review of Systems  Constitutional: Negative for chills, diaphoresis, fatigue and fever.  Gastrointestinal: Negative for abdominal pain, anal bleeding, blood in stool, nausea, rectal pain and vomiting.  Musculoskeletal: Negative for back pain.  Skin: Positive for wound.    Patient Active Problem List   Diagnosis Date Noted  . Dyspnea   . Moderate persistent asthma 02/17/2016  . Tobacco abuse 02/17/2016  . Opiate dependence (HCC)/Suboxone Rx 02/17/2016  . Hypoxia 02/17/2016  . Acute respiratory failure with hypoxia (HCC) 02/17/2016  . Bronchospasm     Current Outpatient Prescriptions on File Prior to Visit  Medication Sig Dispense Refill  . SUBOXONE 8-2 MG FILM Place 1 Film under the tongue 2 (two) times daily.  1  . albuterol (PROVENTIL) (2.5 MG/3ML) 0.083% nebulizer solution Inhale 3 mLs into the lungs every 6 (six) hours as needed for wheezing or shortness of breath.   0  . beclomethasone (QVAR) 80 MCG/ACT inhaler Inhale 2 puffs into the lungs 2 (two) times daily. (Patient not taking: Reported on 07/27/2017) 1 Inhaler 6  . PROAIR HFA 108 (90 Base) MCG/ACT inhaler Inhale 2 puffs into the lungs every 4 (four) hours as needed for wheezing or shortness of breath. (Patient not taking: Reported on 07/27/2017) 1 Inhaler 2  . traZODone (DESYREL) 50 MG tablet Take 50 mg by mouth at bedtime as needed for sleep.   0   No current facility-administered medications on file prior to visit.     No Known Allergies   Objective:  BP 116/80   Pulse (!) 116   Temp 98.8 F (37.1 C) (Oral)   Resp 16   Ht 6' (1.829 m)   Wt  198 lb 3.2 oz (89.9 kg)   SpO2 98%   BMI 26.88 kg/m   Physical Exam  Constitutional: He is oriented to person, place, and time and well-developed, well-nourished, and in no distress. No distress.  Cardiovascular: Normal rate, regular rhythm and normal heart sounds.   Neurological: He is alert and oriented to person, place, and time. GCS score is 15.  Skin: Skin is warm and dry.     Psychiatric: Mood, memory, affect and judgment normal.  Vitals reviewed.  Procedure: Verbal consent obtained. Skin was cleaned with alcohol and anesthetized with 2% lidocaine with epinephrine. A 1.5 cm incision was made. A large amount of purulent material sprung forth upon incision of ascess. Culture taken Wound was explored and estimated to be approximately 3 cm in depth, wound irrigated with 20 cc normal saline. Wound was packed with 1/2 inch packing. Wound dressed and wound care discussed.  Assessment and Plan :  1. Pilonidal abscess 2. Gluteal pain - doxycycline (VIBRA-TABS) 100 MG tablet; Take 1 tablet (100 mg total) by mouth 2 (two) times daily.  Dispense: 20 tablet; Refill: 0 - WOUND CULTURE - Pt presents with pain x 2 days. Endorses relief of pressure following I&D. Wound packed with 1/2in packing and dressed. Will cover with Doxy. Encouraged warm compress. RTC in 24 hours for wound check.    Marco CollieWhitney Amarie Tarte, PA-C  [facility] at [facility] 07/27/2017 4:03 PM

## 2017-07-27 NOTE — Patient Instructions (Addendum)
Change dressing if dressing is soiled. Keep covered at all times except when showering. You may get wet in the shower but do not scrub.  Apply warm compresses/heating pad to facilitate drainage. Leave packing in place. Do not apply any ointments as this may delay healing. If an antibiotic was prescribed, take as directed until finished. Return in 24-48 hours for wound care.   How to Take a Sitz Bath A sitz bath is a warm water bath that is taken while you are sitting down. The water should only come up to your hips and should cover your buttocks. Your health care provider may recommend a sitz bath to help you:  Clean the lower part of your body, including your genital area.  With itching.  With pain.  With sore muscles or muscles that tighten or spasm.  How to take a sitz bath Take 3-4 sitz baths per day or as told by your health care provider. 1. Partially fill a bathtub with warm water. You will only need the water to be deep enough to cover your hips and buttocks when you are sitting in it. 2. If your health care provider told you to put medicine in the water, follow the directions exactly. 3. Sit in the water and open the tub drain a little. 4. Turn on the warm water again to keep the tub at the correct level. Keep the water running constantly. 5. Soak in the water for 15-20 minutes or as told by your health care provider. 6. After the sitz bath, pat the affected area dry first. Do not rub it. 7. Be careful when you stand up after the sitz bath because you may feel dizzy.  Contact a health care provider if:  Your symptoms get worse. Do not continue with sitz baths if your symptoms get worse.  You have new symptoms. Do not continue with sitz baths until you talk with your health care provider. This information is not intended to replace advice given to you by your health care provider. Make sure you discuss any questions you have with your health care provider. Document  Released: 06/03/2004 Document Revised: 02/09/2016 Document Reviewed: 09/09/2014 Elsevier Interactive Patient Education  2018 [employer] Inc[employer]   Pilonidal Cyst A pilonidal cyst is a fluid-filled sac. It forms beneath the skin near your tailbone, at the top of the crease of your buttocks. A pilonidal cyst that is not large or infected may not cause symptoms or problems. If the cyst becomes irritated or infected, it may fill with pus. This causes pain and swelling (pilonidal abscess). An infected cyst may need to be treated with medicine, drained, or removed. What are the causes? The cause of a pilonidal cyst is not known. One cause may be a hair that grows into your skin (ingrown hair). What increases the risk? Pilonidal cysts are more common in boys and men. Risk factors include:  Having lots of hair near the crease of the buttocks.  Being overweight.  Having a pilonidal dimple.  Wearing tight clothing.  Not bathing or showering frequently.  Sitting for long periods of time.  What are the signs or symptoms? Signs and symptoms of a pilonidal cyst may include:  Redness.  Pain and tenderness.  Warmth.  Swelling.  Pus.  Fever.  How is this diagnosed? Your health care provider may diagnose a pilonidal cyst based on your symptoms and a physical exam. The health care provider may do a blood test to check for infection. If your cyst  is draining pus, your health care provider may take a sample of the drainage to be tested at a laboratory. How is this treated? Surgery is the usual treatment for an infected pilonidal cyst. You may also have to take medicines before surgery. The type of surgery you have depends on the size and severity of the infected cyst. The different kinds of surgery include:  Incision and drainage. This is a procedure to open and drain the cyst.  Marsupialization. In this procedure, a large cyst or abscess may be opened and kept open by stitching the edges of the  skin to the cyst walls.  Cyst removal. This procedure involves opening the skin and removing all or part of the cyst.  Follow these instructions at home:  Follow all of your surgeon's instructions carefully if you had surgery.  Take medicines only as directed by your health care provider.  If you were prescribed an antibiotic medicine, finish it all even if you start to feel better.  Keep the area around your pilonidal cyst clean and dry.  Clean the area as directed by your health care provider. Pat the area dry with a clean towel. Do not rub it as this may cause bleeding.  Remove hair from the area around the cyst as directed by your health care provider.  Do not wear tight clothing or sit in one place for long periods of time.  There are many different ways to close and cover an incision, including stitches, skin glue, and adhesive strips. Follow your health care provider's instructions on: ? Incision care. ? Bandage (dressing) changes and removal. ? Incision closure removal. Contact a health care provider if:  You have drainage, redness, swelling, or pain at the site of the cyst.  You have a fever. This information is not intended to replace advice given to you by your health care provider. Make sure you discuss any questions you have with your health care provider. Document Released: 09/08/2000 Document Revised: 02/17/2016 Document Reviewed: 01/29/2014 Elsevier Interactive Patient Education  2018 [employer]   IF you received an x-ray today, you will receive an invoice from [facility]. Please contact [facility] at [phone] with questions or concerns regarding your invoice.   IF you received labwork today, you will receive an invoice from [facility]. Please contact [facility] at [phone] with questions or concerns regarding your invoice.   Our billing staff will not be able to assist you with questions regarding bills from these companies.  You  will be contacted with the lab results as soon as they are available. The fastest way to get your results is to activate your My Chart account. Instructions are located on the last page of this paperwork. If you have not heard from us regarding the results in 2 weeks, please contact this office.

## 2017-07-30 ENCOUNTER — Ambulatory Visit (INDEPENDENT_AMBULATORY_CARE_PROVIDER_SITE_OTHER): Payer: BLUE CROSS/BLUE SHIELD | Admitting: Physician Assistant

## 2017-07-30 ENCOUNTER — Encounter: Payer: Self-pay | Admitting: Physician Assistant

## 2017-07-30 VITALS — BP 139/83 | HR 89 | Temp 98.3°F | Resp 16 | Ht 71.5 in | Wt 202.2 lb

## 2017-07-30 DIAGNOSIS — L0501 Pilonidal cyst with abscess: Secondary | ICD-10-CM

## 2017-07-30 DIAGNOSIS — Z5189 Encounter for other specified aftercare: Secondary | ICD-10-CM

## 2017-07-30 LAB — WOUND CULTURE: Organism ID, Bacteria: NONE SEEN

## 2017-07-30 NOTE — Progress Notes (Signed)
   Evan Hines  MRN: [id number] DOB: 05/18/1986  PCP: Patient, No Pcp Per  Subjective:  Pt is a [age] year old male who presents to clinic for f/u wound care s/p I&D of pilonidal cyst three days ago.  He is feeling well today. Endorses a lot of improvement. Hurts if he sits on it or with lots of walking.  He is taking antibiotic. Changing dressing daily. Is doing sits baths.  Denies fever, chills, swelling, draining.   Review of Systems  Constitutional: Negative for chills, diaphoresis, fatigue and fever.  Skin: Positive for wound.    Patient Active Problem List   Diagnosis Date Noted  . Dyspnea   . Moderate persistent asthma 02/17/2016  . Tobacco abuse 02/17/2016  . Opiate dependence (HCC)/Suboxone Rx 02/17/2016  . Hypoxia 02/17/2016  . Acute respiratory failure with hypoxia (HCC) 02/17/2016  . Bronchospasm     Current Outpatient Medications on File Prior to Visit  Medication Sig Dispense Refill  . albuterol (PROVENTIL) (2.5 MG/3ML) 0.083% nebulizer solution Inhale 3 mLs into the lungs every 6 (six) hours as needed for wheezing or shortness of breath.   0  . beclomethasone (QVAR) 80 MCG/ACT inhaler Inhale 2 puffs into the lungs 2 (two) times daily. 1 Inhaler 6  . doxycycline (VIBRA-TABS) 100 MG tablet Take 1 tablet (100 mg total) by mouth 2 (two) times daily. 20 tablet 0  . PROAIR HFA 108 (90 Base) MCG/ACT inhaler Inhale 2 puffs into the lungs every 4 (four) hours as needed for wheezing or shortness of breath. 1 Inhaler 2  . SUBOXONE 8-2 MG FILM Place 1 Film under the tongue 2 (two) times daily.  1  . traZODone (DESYREL) 50 MG tablet Take 50 mg by mouth at bedtime as needed for sleep.   0   No current facility-administered medications on file prior to visit.     No Known Allergies   Objective:  BP 139/83   Pulse 89   Temp 98.3 F (36.8 C) (Oral)   Resp 16   Ht 5' 11.5" (1.816 m)   Wt 202 lb 3.2 oz (91.7 kg)   SpO2 99%   BMI 27.81 kg/m   Physical Exam    Constitutional: He is oriented to person, place, and time and well-developed, well-nourished, and in no distress. No distress.  Neurological: He is alert and oriented to person, place, and time. GCS score is 15.  Skin: Skin is warm and dry.     Psychiatric: Mood, memory, affect and judgment normal.  Vitals reviewed.   Assessment and Plan :  1. Encounter for wound care 2. Pilonidal abscess - Wound is healing very well. Repacked lightly with 1/2 in dressing and dressed. Con't sits baths, warm compress and daily dressing changes. RTC in 48 hours for recheck. Consider writing a return to work note at that OV.   Marco CollieWhitney Likisha Alles, PA-C  Primary Care at [facility] 07/30/2017 11:49 AM

## 2017-07-30 NOTE — Patient Instructions (Signed)
     IF you received an x-ray today, you will receive an invoice from [facility]. Please contact [facility] at [phone] with questions or concerns regarding your invoice.   IF you received labwork today, you will receive an invoice from [facility]. Please contact [facility] at [phone] with questions or concerns regarding your invoice.   Our billing staff will not be able to assist you with questions regarding bills from these companies.  You will be contacted with the lab results as soon as they are available. The fastest way to get your results is to activate your My Chart account. Instructions are located on the last page of this paperwork. If you have not heard from us regarding the results in 2 weeks, please contact this office.     

## 2017-08-01 ENCOUNTER — Encounter: Payer: Self-pay | Admitting: Physician Assistant

## 2017-08-01 ENCOUNTER — Ambulatory Visit (INDEPENDENT_AMBULATORY_CARE_PROVIDER_SITE_OTHER): Payer: BLUE CROSS/BLUE SHIELD | Admitting: Physician Assistant

## 2017-08-01 VITALS — BP 120/80 | HR 103 | Temp 98.2°F | Resp 17 | Ht 71.5 in | Wt 201.0 lb

## 2017-08-01 DIAGNOSIS — Z5189 Encounter for other specified aftercare: Secondary | ICD-10-CM

## 2017-08-01 DIAGNOSIS — L0501 Pilonidal cyst with abscess: Secondary | ICD-10-CM

## 2017-08-01 NOTE — Patient Instructions (Signed)
     IF you received an x-ray today, you will receive an invoice from [facility]. Please contact [facility] at [phone] with questions or concerns regarding your invoice.   IF you received labwork today, you will receive an invoice from [facility]. Please contact [facility] at [phone] with questions or concerns regarding your invoice.   Our billing staff will not be able to assist you with questions regarding bills from these companies.  You will be contacted with the lab results as soon as they are available. The fastest way to get your results is to activate your My Chart account. Instructions are located on the last page of this paperwork. If you have not heard from us regarding the results in 2 weeks, please contact this office.     

## 2017-08-02 NOTE — Telephone Encounter (Signed)
error 

## 2017-08-03 ENCOUNTER — Ambulatory Visit: Payer: BLUE CROSS/BLUE SHIELD | Admitting: Urgent Care

## 2017-08-07 NOTE — Progress Notes (Signed)
   Evan PontJonathan Hines  MRN: [id number] DOB: 01/02/1986  PCP: Patient, No Pcp Per  Subjective:  Pt is a pleasant 31 year old male who presents to clinic for f/u wound care s/p I&D of pilonidal cyst five days ago.  He is feeling well. Endorses improvement. He is taking antibiotic. Changing dressing daily. Is doing sits baths. Denies fever, chills, swelling, draining.   Review of Systems  Constitutional: Negative for chills, diaphoresis, fatigue and fever.  Skin: Positive for wound.  Psychiatric/Behavioral: Negative for sleep disturbance.    Patient Active Problem List   Diagnosis Date Noted  . Dyspnea   . Moderate persistent asthma 02/17/2016  . Tobacco abuse 02/17/2016  . Opiate dependence (HCC)/Suboxone Rx 02/17/2016  . Hypoxia 02/17/2016  . Acute respiratory failure with hypoxia (HCC) 02/17/2016  . Bronchospasm     Current Outpatient Medications on File Prior to Visit  Medication Sig Dispense Refill  . albuterol (PROVENTIL) (2.5 MG/3ML) 0.083% nebulizer solution Inhale 3 mLs into the lungs every 6 (six) hours as needed for wheezing or shortness of breath.   0  . beclomethasone (QVAR) 80 MCG/ACT inhaler Inhale 2 puffs into the lungs 2 (two) times daily. 1 Inhaler 6  . doxycycline (VIBRA-TABS) 100 MG tablet Take 1 tablet (100 mg total) by mouth 2 (two) times daily. 20 tablet 0  . PROAIR HFA 108 (90 Base) MCG/ACT inhaler Inhale 2 puffs into the lungs every 4 (four) hours as needed for wheezing or shortness of breath. 1 Inhaler 2  . SUBOXONE 8-2 MG FILM Place 1 Film under the tongue 2 (two) times daily.  1  . traZODone (DESYREL) 50 MG tablet Take 50 mg by mouth at bedtime as needed for sleep.   0   No current facility-administered medications on file prior to visit.     No Known Allergies   Objective:  BP 120/80   Pulse (!) 103   Temp 98.2 F (36.8 C) (Oral)   Resp 17   Ht 5' 11.5" (1.816 m)   Wt 201 lb (91.2 kg)   SpO2 98%   BMI 27.64 kg/m   Physical Exam  Constitutional:  He is oriented to person, place, and time and well-developed, well-nourished, and in no distress. No distress.  Neurological: He is alert and oriented to person, place, and time. GCS score is 15.  Skin: Skin is warm and dry.     Psychiatric: Mood, memory, affect and judgment normal.  Vitals reviewed.   Assessment and Plan :  1. Encounter for wound care 2. Pilonidal abscess - Healing very well. RTC in 48 hours for recheck. Wound care discussed.    Marco CollieWhitney Netra Postlethwait, PA-C  Primary Care at [facility] 08/07/2017 6:04 PM

## 2017-10-08 DIAGNOSIS — Z79899 Other long term (current) drug therapy: Secondary | ICD-10-CM | POA: Diagnosis not present

## 2019-04-11 ENCOUNTER — Other Ambulatory Visit: Payer: Self-pay

## 2019-04-11 ENCOUNTER — Emergency Department (HOSPITAL_COMMUNITY)
Admission: EM | Admit: 2019-04-11 | Discharge: 2019-04-11 | Disposition: A | Discharge status: Home / Self Care | Payer: BC Managed Care – PPO | Attending: Emergency Medicine | Admitting: Emergency Medicine

## 2019-04-11 ENCOUNTER — Encounter (HOSPITAL_COMMUNITY): Payer: Self-pay | Admitting: Emergency Medicine

## 2019-04-11 ENCOUNTER — Emergency Department (HOSPITAL_COMMUNITY): Payer: BC Managed Care – PPO

## 2019-04-11 DIAGNOSIS — F17210 Nicotine dependence, cigarettes, uncomplicated: Secondary | ICD-10-CM | POA: Insufficient documentation

## 2019-04-11 DIAGNOSIS — F17220 Nicotine dependence, chewing tobacco, uncomplicated: Secondary | ICD-10-CM | POA: Insufficient documentation

## 2019-04-11 DIAGNOSIS — Z79899 Other long term (current) drug therapy: Secondary | ICD-10-CM | POA: Diagnosis not present

## 2019-04-11 DIAGNOSIS — N132 Hydronephrosis with renal and ureteral calculous obstruction: Secondary | ICD-10-CM | POA: Insufficient documentation

## 2019-04-11 DIAGNOSIS — J45909 Unspecified asthma, uncomplicated: Secondary | ICD-10-CM | POA: Diagnosis not present

## 2019-04-11 DIAGNOSIS — N134 Hydroureter: Secondary | ICD-10-CM | POA: Diagnosis not present

## 2019-04-11 DIAGNOSIS — R1030 Lower abdominal pain, unspecified: Secondary | ICD-10-CM | POA: Diagnosis not present

## 2019-04-11 LAB — URINALYSIS, ROUTINE W REFLEX MICROSCOPIC
Bilirubin Urine: NEGATIVE
Glucose, UA: NEGATIVE mg/dL
Ketones, ur: NEGATIVE mg/dL
Nitrite: NEGATIVE
Protein, ur: 100 mg/dL — AB
RBC / HPF: >50 RBC/hpf — ABNORMAL HIGH (ref 0–5)
Specific Gravity, Urine: 1.018 (ref 1.005–1.030)
pH: 5 (ref 5.0–8.0)

## 2019-04-11 MED ORDER — ONDANSETRON HCL 4 MG/2ML IJ SOLN
4.0000 mg | Freq: Once | INTRAMUSCULAR | Status: AC
  Administered 2019-04-11: 4 mg via INTRAVENOUS
  Filled 2019-04-11: qty 2

## 2019-04-11 MED ORDER — HYDROMORPHONE HCL 1 MG/ML IJ SOLN
1.0000 mg | Freq: Once | INTRAMUSCULAR | Status: AC
Start: 2019-04-11 — End: 2019-04-11
  Administered 2019-04-11: 1 mg via INTRAVENOUS
  Filled 2019-04-11: qty 1

## 2019-04-11 MED ORDER — KETOROLAC TROMETHAMINE 30 MG/ML IJ SOLN
15.0000 mg | Freq: Once | INTRAMUSCULAR | Status: AC
  Administered 2019-04-11: 15 mg via INTRAVENOUS
  Filled 2019-04-11: qty 1

## 2019-04-11 MED ORDER — TAMSULOSIN HCL 0.4 MG PO CAPS: ORAL_CAPSULE | ORAL | 0 refills | Status: AC

## 2019-04-11 MED ORDER — OXYCODONE-ACETAMINOPHEN 5-325 MG PO TABS: 1.0000 | ORAL_TABLET | ORAL | 0 refills | Status: AC | PRN

## 2019-04-11 MED ORDER — FENTANYL CITRATE (PF) 100 MCG/2ML IJ SOLN
100.0000 ug | Freq: Once | INTRAMUSCULAR | Status: AC
  Administered 2019-04-11: 100 ug via INTRAVENOUS
  Filled 2019-04-11: qty 2

## 2019-04-11 NOTE — Discharge Instructions (Signed)
The testing indicates that you have several stones, in the distal left ureter.  These appear to be causing your pain and preventing complete release of urine from the left side.  We are prescribing pain relievers, and a medicine to relax the tube, and help the stones pass.  Strain your urine with a filter, to capture the stones.  Follow-up with urologist for further care and treatment within 1 week, and as needed.

## 2019-04-11 NOTE — ED Notes (Signed)
Pt d/c home per MD order. Discharge summary reviewed, pt verbalizes understanding. Ambulatory off unit. Reports mother is his discharge ride home.

## 2019-04-11 NOTE — ED Provider Notes (Signed)
[facility]-EMERGENCY DEPT Provider Note   CSN: [id number] Arrival date & time: 04/11/19  0731    History   Chief Complaint Chief Complaint  Patient presents with  . Abdominal Pain  . Urinary Retention    HPI Evan Hines is a 33 y.o. male.     HPI   He presents for crampy low midline abdominal pain present since early this morning.  The discomfort caused him to be nauseated and vomit after drinking water.  Pain is severe and persistent.  No prior history of kidney stones.  No recent fever, chills, coughing, shortness of breath, weakness or dizziness.  There are no other known modifying factors.  Past Medical History:  Diagnosis Date  . Asthma   . Substance abuse [facility](HCC)     Patient Active Problem List   Diagnosis Date Noted  . Dyspnea   . Moderate persistent asthma 02/17/2016  . Tobacco abuse 02/17/2016  . Opiate dependence (HCC)/Suboxone Rx 02/17/2016  . Hypoxia 02/17/2016  . Acute respiratory failure with hypoxia (HCC) 02/17/2016  . Bronchospasm     Past Surgical History:  Procedure Laterality Date  . SHOULDER SURGERY          Home Medications    Prior to Admission medications   Medication Sig Start Date End Date Taking? Authorizing Provider  SUBOXONE 8-2 MG FILM Place 1 Film under the tongue 2 (two) times daily. 02/10/16  Yes [provider]  traZODone (DESYREL) 50 MG tablet Take 50 mg by mouth at bedtime as needed for sleep.  12/01/15  Yes [provider]  beclomethasone (QVAR) 80 MCG/ACT inhaler Inhale 2 puffs into the lungs 2 (two) times daily. Patient not taking: Reported on 04/11/2019 04/25/16   Lupita LeashMcQuaid, Douglas B, MD  oxyCODONE-acetaminophen (PERCOCET/ROXICET) 5-325 MG tablet Take 1 tablet by mouth every 4 (four) hours as needed for severe pain. 04/11/19   Mancel BaleWentz, Vangie Henthorn, MD  PROAIR HFA [phone](90 Base) MCG/ACT inhaler Inhale 2 puffs into the lungs every 4 (four) hours as needed for wheezing or shortness of breath.  Patient not taking: Reported on 04/11/2019 02/19/16   Clydia LlanoElmahi, Mutaz, MD  tamsulosin [facility](FLOMAX) 0.4 MG CAPS capsule 1 q HS to aid stone passage 04/11/19   Mancel BaleWentz, Arizbeth Cawthorn, MD    Family History No family history on file.  Social History Social History   Tobacco Use  . Smoking status: Current Every Day Smoker    Packs/day: 1.00    Years: 10.00    Pack years: 10.00    Types: Cigarettes  . Smokeless tobacco: Current User    Types: Chew  Substance Use Topics  . Alcohol use: Yes    Alcohol/week: 6.0 - 7.0 standard drinks    Types: 6 - 7 Standard drinks or equivalent per week  . Drug use: No     Allergies   Patient has no known allergies.   Review of Systems Review of Systems  All other systems reviewed and are negative.    Physical Exam Updated Vital Signs BP (!) 176/111   Pulse (!) 44   Temp (!) 97.5 F (36.4 C) (Oral)   Resp 17   SpO2 97%   Physical Exam Vitals signs and nursing note reviewed.  Constitutional:      General: He is in acute distress (He is uncomfortable).     Appearance: He is well-developed. He is not ill-appearing, toxic-appearing or diaphoretic.  HENT:     Head: Normocephalic and atraumatic.     Right Ear:  External ear normal.     Left Ear: External ear normal.  Eyes:     Conjunctiva/sclera: Conjunctivae normal.     Pupils: Pupils are equal, round, and reactive to light.  Neck:     Musculoskeletal: Normal range of motion and neck supple.     Trachea: Phonation normal.  Cardiovascular:     Rate and Rhythm: Normal rate and regular rhythm.     Heart sounds: Normal heart sounds.  Pulmonary:     Effort: Pulmonary effort is normal.     Breath sounds: Normal breath sounds.  Abdominal:     General: There is no distension.     Palpations: Abdomen is soft. There is no mass (No palpable urinary bladder mass.).     Tenderness: There is abdominal tenderness (Suprapubic, mild).     Hernia: No hernia is present.  Musculoskeletal: Normal range of motion.   Skin:    General: Skin is warm and dry.  Neurological:     Mental Status: He is alert and oriented to person, place, and time.     Cranial Nerves: No cranial nerve deficit.     Sensory: No sensory deficit.     Motor: No abnormal muscle tone.     Coordination: Coordination normal.  Psychiatric:        Mood and Affect: Mood normal.        Behavior: Behavior normal.        Thought Content: Thought content normal.        Judgment: Judgment normal.      ED Treatments / Results  Labs (all labs ordered are listed, but only abnormal results are displayed) Labs Reviewed  URINALYSIS, ROUTINE W REFLEX MICROSCOPIC - Abnormal; Notable for the following components:      Result Value   Color, Urine YELLOW (*)    APPearance TURBID (*)    Hgb urine dipstick LARGE (*)    Protein, ur 100 (*)    Leukocytes,Ua MODERATE (*)    RBC / HPF >50 (*)    Bacteria, UA MANY (*)    All other components within normal limits    EKG None  Radiology Ct Renal Stone Study  Result Date: 04/11/2019 CLINICAL DATA:  Flank and mid to lower abdominal pain with urinary retention since 2 a.m. today. The pain is worsening. EXAM: CT ABDOMEN AND PELVIS WITHOUT CONTRAST TECHNIQUE: Multidetector CT imaging of the abdomen and pelvis was performed following the standard protocol without IV contrast. COMPARISON:  None. FINDINGS: Lower chest: Unremarkable. Hepatobiliary: No focal liver abnormality is seen. No gallstones, gallbladder wall thickening, or biliary dilatation. Pancreas: Unremarkable. No pancreatic ductal dilatation or surrounding inflammatory changes. Spleen: Normal in size without focal abnormality. Adrenals/Urinary Tract: Normal appearing adrenal glands. Moderately enlarged left kidney with mild perinephric soft tissue stranding. Mild to moderate dilatation of the left renal collecting system and ureter to the level of the ureterovesical junction, with mild periureteric soft tissue stranding. This is dilated to the  level of a 7 mm linear calcification in the posterior aspect of the urinary bladder on the left at the ureterovesical junction. There are approximately 4 additional tiny calcifications in the posterior aspect of the distal left ureter. Tiny lower pole left renal calculus. Normal appearing right kidney and right ureter. Stomach/Bowel: Stomach is within normal limits. Appendix appears normal. No evidence of bowel wall thickening, distention, or inflammatory changes. Vascular/Lymphatic: No significant vascular findings are present. No enlarged abdominal or pelvic lymph nodes. Reproductive: Prostate is unremarkable. Other: Tiny  umbilical hernia containing fat. Musculoskeletal: Normal appearing bones. IMPRESSION: 1. 7 mm linear calcification at the distal ureterovesical junction, causing mild to moderate left hydronephrosis and hydroureter. This may represent multiple tiny decent calculi. 2. 4 additional tiny, nonobstructing distal left ureteral calculi. 3. Tiny, nonobstructing lower pole left renal calculus. Electronically Signed   By: Beckie SaltsSteven  Reid M.D.   On: 04/11/2019 09:48    Procedures Procedures (including critical care time)  Medications Ordered in ED Medications  fentaNYL (SUBLIMAZE) injection 100 mcg (100 mcg Intravenous Given 04/11/19 1021)  ondansetron (ZOFRAN) injection 4 mg (4 mg Intravenous Given 04/11/19 1020)  HYDROmorphone (DILAUDID) injection 1 mg (1 mg Intravenous Given 04/11/19 1240)  ketorolac (TORADOL) 30 MG/ML injection 15 mg (15 mg Intravenous Given 04/11/19 1241)     Initial Impression / Assessment and Plan / ED Course  I have reviewed the triage vital signs and the nursing notes.  Pertinent labs & imaging results that were available during my care of the patient were reviewed by me and considered in my medical decision making (see chart for details).  Clinical Course as of Apr 10 1310  Fri Apr 11, 2019  1005 Abnormal, presence of hemoglobin, protein, leukocytes, red cells,  white cells, bacteria.  Urinalysis, Routine w reflex microscopic- may I&O cath if menses(!) [EW]  1005 Abnormal, hydronephrosis, hydroureter, and suspected distal ureteral vesicular junction obstruction secondary to small kidney stones, multiple.  Images reviewed by me  CT Renal Soundra PilonStone Study [EW]    Clinical Course User Index [EW] Mancel BaleWentz, Semiyah Newgent, MD        Patient Vitals for the past 24 hrs:  BP Temp Temp src Pulse Resp SpO2  04/11/19 1230 (!) 176/111 - - (!) 44 - 97 %  04/11/19 1200 (!) 170/122 - - (!) 44 - 95 %  04/11/19 1100 (!) 185/112 - - 62 17 96 %  04/11/19 1045 - - - 69 - 97 %  04/11/19 1030 (!) 169/109 - - 69 17 97 %  04/11/19 1000 (!) 146/116 - - - 18 -  04/11/19 0930 (!) 179/110 - - (!) 52 18 95 %  04/11/19 0900 (!) 176/124 - - (!) 48 17 97 %  04/11/19 0830 (!) 181/120 - - (!) 51 - 97 %  04/11/19 0800 (!) 175/133 - - (!) 50 - 98 %  04/11/19 0750 (!) 185/131 - - 62 - 97 %  04/11/19 0740 (!) 189/127 (!) 97.5 F (36.4 C) Oral (!) 56 20 96 %    1:07 PM Reevaluation with update and discussion. After initial assessment and treatment, an updated evaluation reveals he is fairly comfortable at this time.  Findings discussed with the patient and all questions were answered. Mancel BaleElliott Naphtali Riede   Medical Decision Making: Abdominal left flank pain secondary to obstructing kidney stones.  Patient's stones are relatively small but he has multiple stones in the left ureter.  Urinary tract infection, serious bacterial infection or impending vascular collapse.  CRITICAL CARE-no Performed by: Mancel BaleElliott Kely Dohn   Nursing Notes Reviewed/ Care Coordinated Applicable Imaging Reviewed Interpretation of Laboratory Data incorporated into ED treatment  The patient appears reasonably screened and/or stabilized for discharge and I doubt any other medical condition or other [facility] requiring further screening, evaluation, or treatment in the ED at this time prior to discharge.  Plan: Home  Medications-continue usual medications; Home Treatments-strain urine; return here if the recommended treatment, does not improve the symptoms; Recommended follow up-urology follow-up 1 week and as needed  Final Clinical Impressions(s) / ED Diagnoses   Final diagnoses:  Ureteral stone with hydronephrosis    ED Discharge Orders         Ordered    oxyCODONE-acetaminophen (PERCOCET/ROXICET) 5-325 MG tablet  Every 4 hours PRN     04/11/19 1307    tamsulosin (FLOMAX) 0.4 MG CAPS capsule     04/11/19 1307           Mancel BaleWentz, Samwise Eckardt, MD 04/11/19 1311

## 2019-04-11 NOTE — ED Triage Notes (Signed)
Pt c/o of mid to lower abd pains and urinary retention since around 2am. Reports pains are getting worse.

## 2019-04-11 NOTE — ED Notes (Signed)
EDP at bedside  

## 2019-05-30 ENCOUNTER — Other Ambulatory Visit: Payer: Self-pay

## 2019-05-30 DIAGNOSIS — Z20822 Contact with and (suspected) exposure to covid-19: Secondary | ICD-10-CM

## 2019-05-30 DIAGNOSIS — R6889 Other general symptoms and signs: Secondary | ICD-10-CM | POA: Diagnosis not present

## 2019-05-31 LAB — NOVEL CORONAVIRUS, NAA: SARS-CoV-2, NAA: NOT DETECTED

## 2021-01-30 IMAGING — CT CT RENAL STONE PROTOCOL
2 of 4 series · 16 of 46 positions shown, 18 images · non-contrast
Comparison: None.

CLINICAL DATA: Flank and mid to lower abdominal pain with urinary
retention since 2 a.m. today. The pain is worsening.

EXAM:
CT ABDOMEN AND PELVIS WITHOUT CONTRAST
TECHNIQUE: Multidetector CT imaging of the abdomen and pelvis was performed
following the standard protocol without IV contrast.

[Series 2: axial st · axial · 0.80mm/px · z∈[-462,-27]mm · 13 of 99 slices shown, 15 images]
[im 6/99  soft-tissue]
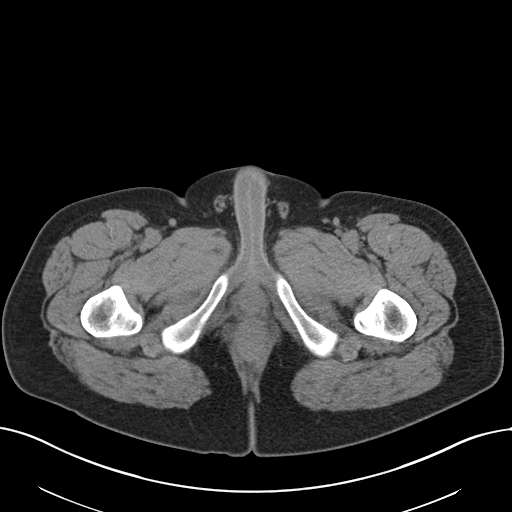
[im 6/99  bone]
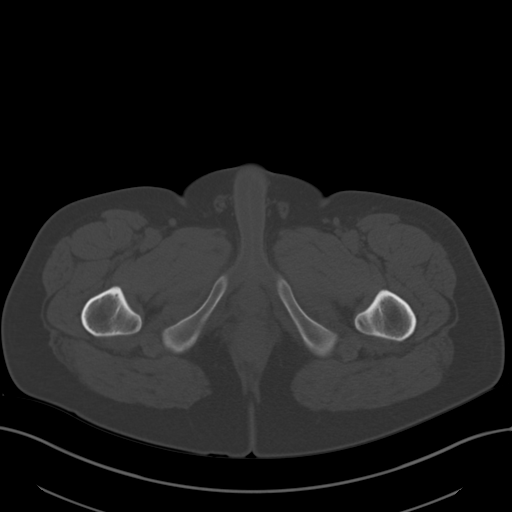
[im 16/99  soft-tissue]
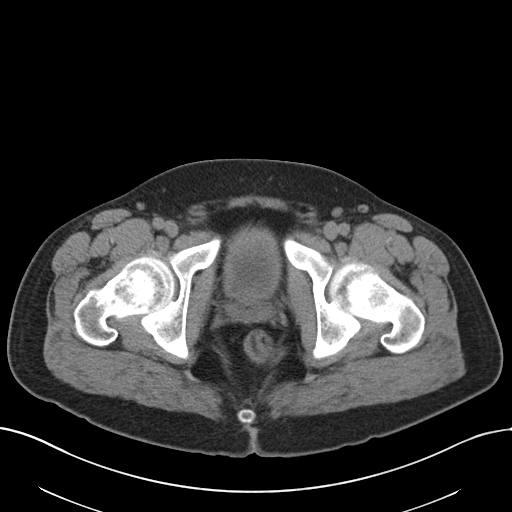
[im 21/99  soft-tissue]
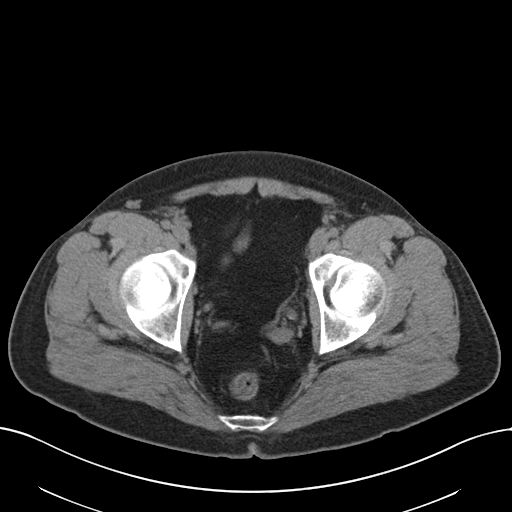
[im 26/99  soft-tissue]
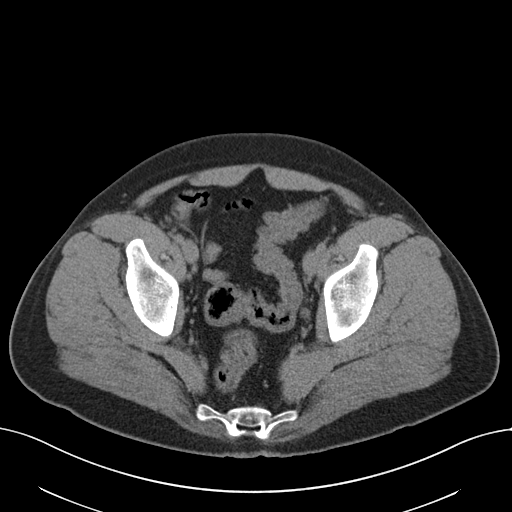
[im 37/99  soft-tissue]
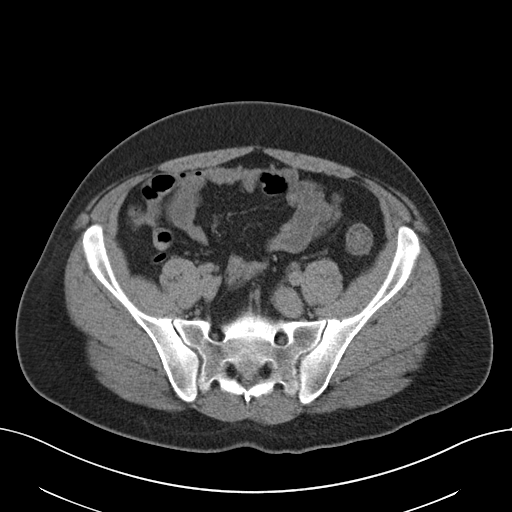
[im 42/99  soft-tissue]
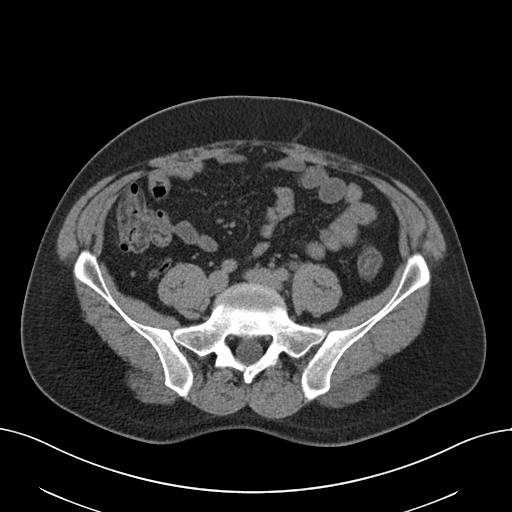
[im 52/99  soft-tissue]
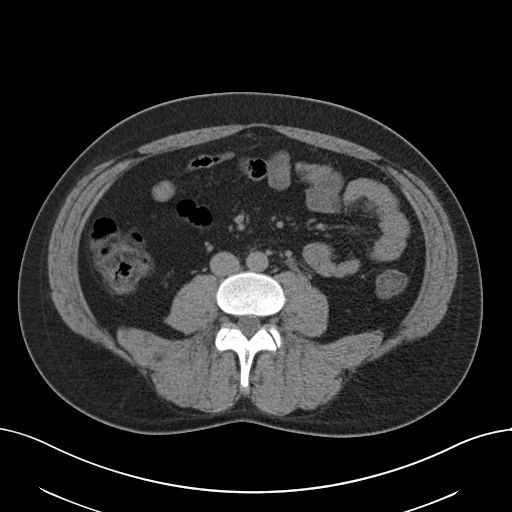
[im 57/99  soft-tissue]
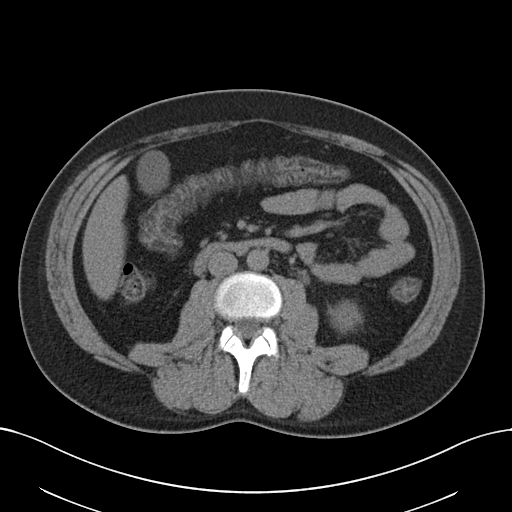
[im 62/99  soft-tissue]
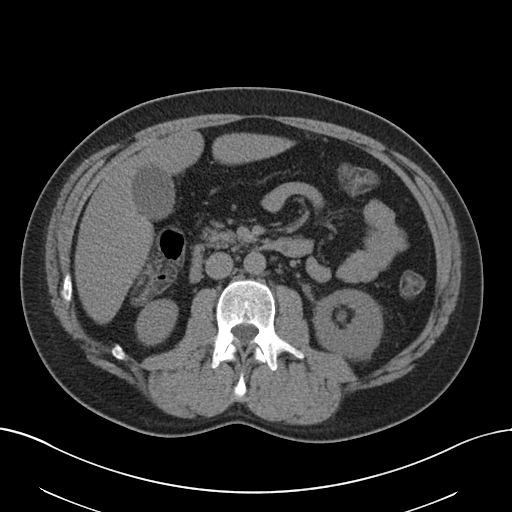
[im 62/99  bone]
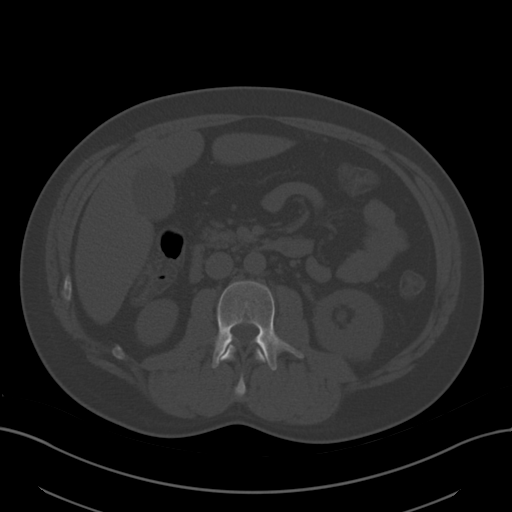
[im 73/99  soft-tissue]
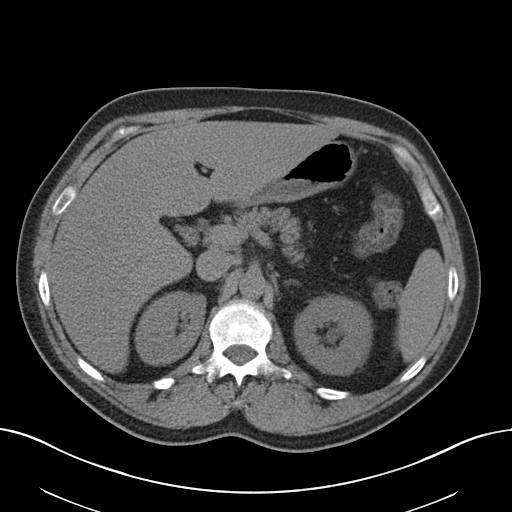
[im 78/99  soft-tissue]
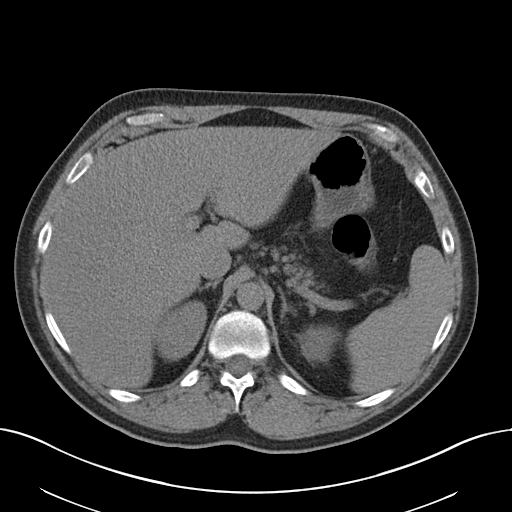
[im 83/99  soft-tissue]
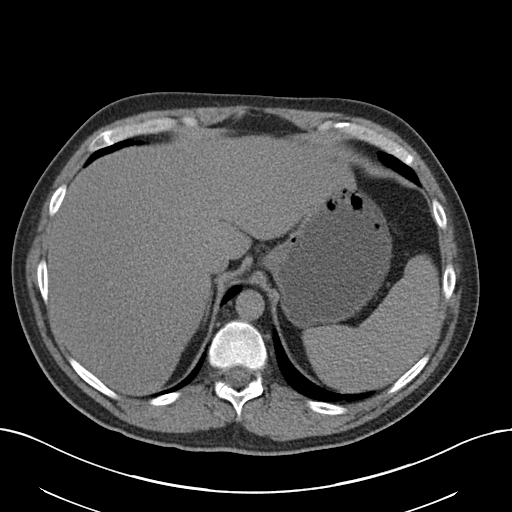
[im 93/99  soft-tissue]
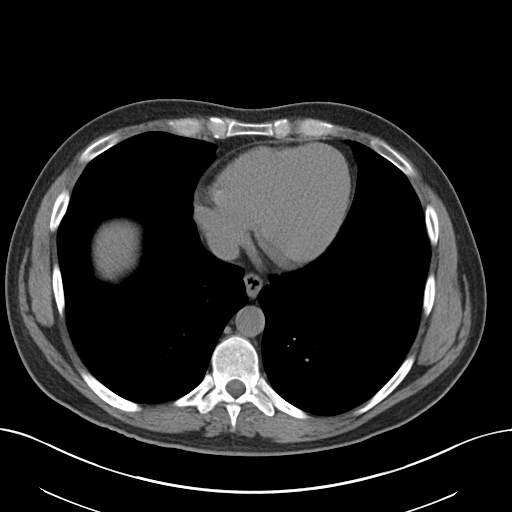

[Series 4: coronal · coronal · 0.74mm/px · 3 of 155 slices shown]
[im 52/155  soft-tissue]
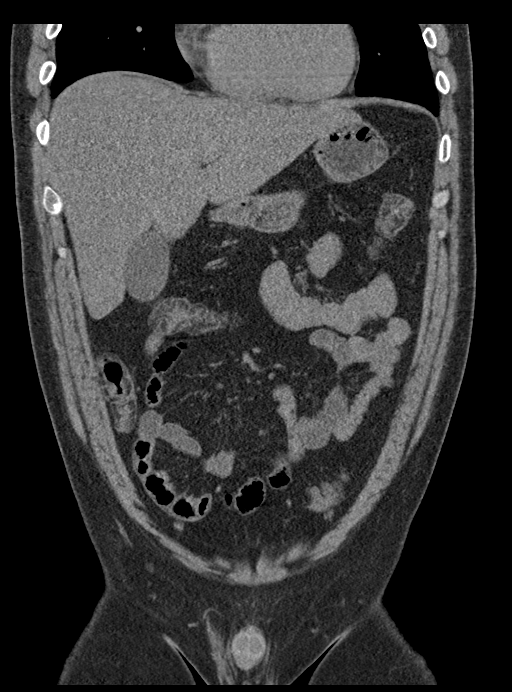
[im 69/155  soft-tissue]
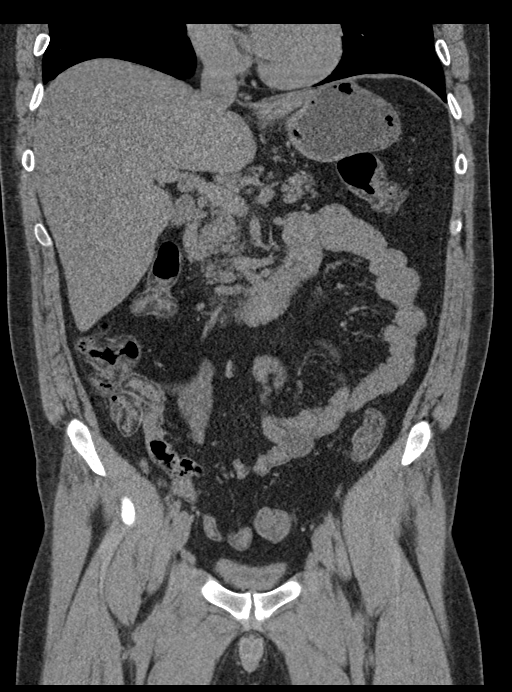
[im 86/155  soft-tissue]
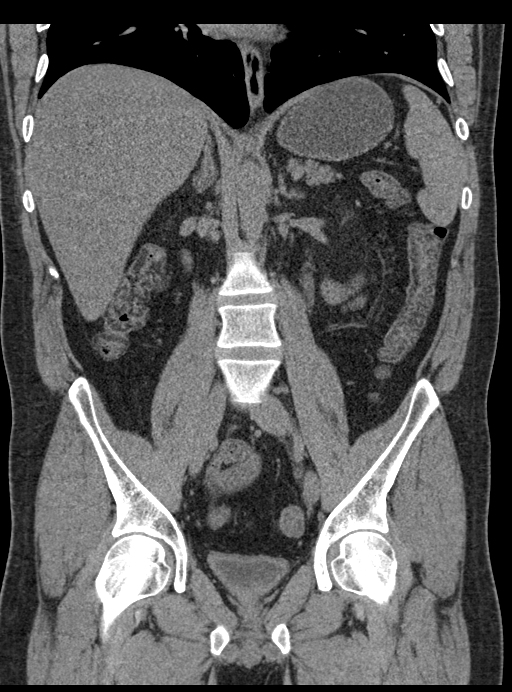

[16 of 46 positions shown; findings below may reference images not displayed]

FINDINGS: Lower chest: Unremarkable.

Hepatobiliary: No focal liver abnormality is seen. No gallstones,
gallbladder wall thickening, or biliary dilatation.

Pancreas: Unremarkable. No pancreatic ductal dilatation or
surrounding inflammatory changes.

Spleen: Normal in size without focal abnormality.

Adrenals/Urinary Tract: Normal appearing adrenal glands.

Moderately enlarged left kidney with mild perinephric soft tissue
stranding. Mild to moderate dilatation of the left renal collecting
system and ureter to the level of the ureterovesical junction, with
mild periureteric soft tissue stranding. This is dilated to the
level of a 7 mm linear calcification in the posterior aspect of the
urinary bladder on the left at the ureterovesical junction. There
are approximately 4 additional tiny calcifications in the posterior
aspect of the distal left ureter.

Tiny lower pole left renal calculus. Normal appearing right kidney
and right ureter.

Stomach/Bowel: Stomach is within normal limits. Appendix appears
normal. No evidence of bowel wall thickening, distention, or
inflammatory changes.

Vascular/Lymphatic: No significant vascular findings are present. No
enlarged abdominal or pelvic lymph nodes.

Reproductive: Prostate is unremarkable.

Other: Tiny umbilical hernia containing fat.

Musculoskeletal: Normal appearing bones.
IMPRESSION: 1. 7 mm linear calcification at the distal ureterovesical junction,
causing mild to moderate left hydronephrosis and hydroureter. This
may represent multiple tiny decent calculi.
2. 4 additional tiny, nonobstructing distal left ureteral calculi.
3. Tiny, nonobstructing lower pole left renal calculus.
